# Patient Record
Sex: Female | Born: 1970 | Race: White | Hispanic: No | Smoking: Never smoker
Health system: Southern US, Community
[De-identification: ages and names within clinical notes are randomized; demographics above are authoritative.]

## PROBLEM LIST (undated history)

## (undated) DIAGNOSIS — F32A Depression, unspecified: Secondary | ICD-10-CM

## (undated) DIAGNOSIS — K5190 Ulcerative colitis, unspecified, without complications: Secondary | ICD-10-CM

## (undated) DIAGNOSIS — F329 Major depressive disorder, single episode, unspecified: Secondary | ICD-10-CM

## (undated) DIAGNOSIS — I10 Essential (primary) hypertension: Secondary | ICD-10-CM

## (undated) HISTORY — DX: Essential (primary) hypertension: I10

## (undated) HISTORY — PX: CATARACT EXTRACTION: SUR2

## (undated) HISTORY — DX: Depression, unspecified: F32.A

## (undated) HISTORY — PX: OTHER SURGICAL HISTORY: SHX169

## (undated) HISTORY — DX: Ulcerative colitis, unspecified, without complications: K51.90

---
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
## 2016-09-09 HISTORY — PX: COLECTOMY WITH COLOSTOMY CREATION/HARTMANN PROCEDURE: SHX6598

## 2017-10-17 ENCOUNTER — Other Ambulatory Visit: Payer: Self-pay | Admitting: Physician Assistant

## 2017-10-17 DIAGNOSIS — Z139 Encounter for screening, unspecified: Secondary | ICD-10-CM

## 2017-11-06 ENCOUNTER — Ambulatory Visit: Payer: Self-pay

## 2019-05-20 ENCOUNTER — Other Ambulatory Visit: Payer: Self-pay

## 2019-05-20 ENCOUNTER — Ambulatory Visit (INDEPENDENT_AMBULATORY_CARE_PROVIDER_SITE_OTHER): Payer: BC Managed Care – PPO | Admitting: Neurology

## 2019-05-20 ENCOUNTER — Encounter: Payer: Self-pay | Admitting: Neurology

## 2019-05-20 ENCOUNTER — Telehealth: Payer: Self-pay

## 2019-05-20 VITALS — BP 140/70 | HR 75 | Temp 96.2°F | Ht 66.0 in | Wt 278.0 lb

## 2019-05-20 DIAGNOSIS — G510 Bell's palsy: Secondary | ICD-10-CM

## 2019-05-20 MED ORDER — PREDNISONE 20 MG PO TABS: ORAL_TABLET | ORAL | 0 refills | Status: DC

## 2019-05-20 MED ORDER — PREDNISONE 20 MG PO TABS: ORAL_TABLET | ORAL | 0 refills | Status: AC

## 2019-05-20 MED ORDER — ACYCLOVIR 400 MG PO TABS: 800.0000 mg | ORAL_TABLET | Freq: Three times a day (TID) | ORAL | Status: DC

## 2019-05-20 NOTE — Progress Notes (Signed)
[facility] [address] [place]. [place] [address] [phone]       OFFICE CONSULT NOTE  [name] Date of Birth:  04-07-1971 Medical Record Number:  [id number]   Referring MD: Dr. Talbert Forest Reason for Referral: Facial weakness  HPI: Lindsay Diaz is a 48 year old pleasant Caucasian lady seen today for initial consultation visit for right facial weakness.  History is obtained from the patient and referral notes.  Patient states that 2 days ago she woke up and noticed that she has difficulty closing her right eye.  She also noticed that she had a right facial droop and was drooling from the right corner of the mouth and she had trouble blowing her right cheek.  She denied any hyperacusis, alteration of taste.  She denied any pain in the ear or behind the ear.  She does have prior history of right-sided Bell's palsy in October 2014.  At that time she was treated with a course of steroids and acyclovir and she had more severe weakness and took her nearly 7 to 8 months to recover completely.  She had a second episode in 2016 which was much milder and again she was treated with steroids and acyclovir and recovered in 4 to 6 weeks.  She had no respiratory residual weakness.  She has no prior history of tick bite and has not been tested for Lyme's disease.  She denies history suggestive of sarcoidosis.  She has had no brain imaging studies done in the form of CT scan or MRI.  She has a remote history of left frontal laceration following a fall requiring stitches in 2015.  She denies any history of ear injury, head injury with loss of consciousness of skull fracture decreased hearing or recurrent ear infections.  ROS:   14 system review of systems is positive for facial weakness, dryness of eyes eye closure weakness and all other systems negative  PMH:  Past Medical History:  Diagnosis Date  . Depression   . Hypertension   . Ulcerative colitis ([place])     Social History:   Social History   Socioeconomic History  . Marital status: Divorced    Spouse name: Not on file  . Number of children: Not on file  . Years of education: Not on file  . Highest education level: Not on file  Occupational History  . Not on file  Social Needs  . Financial resource strain: Not on file  . Food insecurity    Worry: Not on file    Inability: Not on file  . Transportation needs    Medical: Not on file    Non-medical: Not on file  Tobacco Use  . Smoking status: Never Smoker  . Smokeless tobacco: Never Used  Substance and Sexual Activity  . Alcohol use: Yes    Alcohol/week: 1.0 standard drinks    Types: 1 Glasses of wine per week  . Drug use: Not Currently  . Sexual activity: Not on file  Lifestyle  . Physical activity    Days per week: Not on file    Minutes per session: Not on file  . Stress: Not on file  Relationships  . Social Herbalist on phone: Not on file    Gets together: Not on file    Attends religious service: Not on file    Active member of club or organization: Not on file    Attends meetings of clubs or organizations: Not on file  Relationship status: Not on file  . Intimate partner violence    Fear of current or ex partner: Not on file    Emotionally abused: Not on file    Physically abused: Not on file    Forced sexual activity: Not on file  Other Topics Concern  . Not on file  Social History Narrative  . Not on file    Medications:   Current Outpatient Medications on File Prior to Visit  Medication Sig Dispense Refill  . Acetaminophen (TYLENOL 8 HOUR PO) Take 325 mg by mouth.    Marland Kitchen. atenolol (TENORMIN) 50 MG tablet Take 50 mg by mouth at bedtime.    Marland Kitchen. buPROPion (WELLBUTRIN SR) 150 MG 12 hr tablet Take 150 mg by mouth daily.    . citalopram (CELEXA) 40 MG tablet Take 40 mg by mouth daily.    . mesalamine (LIALDA) 1.2 g EC tablet Take 1.2 g by mouth 2 (two) times daily.    Marland Kitchen. zolpidem (AMBIEN) 10 MG tablet Take 10 mg by mouth  at bedtime.     No current facility-administered medications on file prior to visit.     Allergies:  Not on File  Physical Exam General: Mildly obese middle-aged Caucasian lady, seated, in no evident distress Head: head normocephalic and atraumatic.   Neck: supple with no carotid or supraclavicular bruits Cardiovascular: regular rate and rhythm, no murmurs Musculoskeletal: no deformity Skin:  no rash/petichiae Vascular:  Normal pulses all extremities  Neurologic Exam Mental Status: Awake and fully alert. Oriented to place and time. Recent and remote memory intact. Attention span, concentration and fund of knowledge appropriate. Mood and affect appropriate.  Cranial Nerves: Fundoscopic exam reveals sharp disc margins. Pupils equal, briskly reactive to light. Extraocular movements full without nystagmus. Visual fields full to confrontation. Hearing intact.  Right peripheral pattern facial nerve weakness with weakness of frowning, eye closure, smiling and blowing cheeks.  Sensation intact. Face, tongue, palate moves normally and symmetrically.  No hyperacusis or altered taste Motor: Normal bulk and tone. Normal strength in all tested extremity muscles. Sensory.: intact to touch , pinprick , position and vibratory sensation.  Coordination: Rapid alternating movements normal in all extremities. Finger-to-nose and heel-to-shin performed accurately bilaterally. Gait and Station: Arises from chair without difficulty. Stance is normal. Gait demonstrates normal stride length and balance . Able to heel, toe and tandem walk without difficulty.  Reflexes: 1+ and symmetric. Toes downgoing.      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     PLAN: I had a long discussion with the patient regarding her now recurrent third episode of right-sided facial weakness due to Bell's palsy.  I recommend she use artificial  tears liberally during the day and use Lacri-Lube ointment at night to prevent eye infection.  Prednisone 60 mg daily and taper over 2 weeks.  Acyclovir 800 mg 3 times daily for 10 days.  I have encouraged the patient to drink plenty of fluids and avoid dehydration.  She was also encouraged to do facial muscle exercises.  Check lab work for Lyme disease, sarcoidosis, CMP and CBC.  She will return for follow-up in the future in 2 months or call earlier if necessary. Delia HeadyPramod , MD  [facility] [address] [place], [place] [id number]  Phone [phone] Fax [phone] Note: This document was prepared with digital dictation and possible smart phrase technology. Any transcriptional errors that result from this process  are unintentional.

## 2019-05-20 NOTE — Telephone Encounter (Signed)
Prednisone rx fax to [facility] at [phone] and confirmed via fax.

## 2019-05-20 NOTE — Patient Instructions (Addendum)
I had a long discussion with the patient regarding her now recurrent third episode of right-sided facial weakness due to Bell's palsy.  I recommend she use artificial tears liberally during the day and use Lacri-Lube ointment at night to prevent eye infection.  Prednisone 60 mg daily and taper over 2 weeks.  Acyclovir 800 mg 3 times daily for 10 days.  I have encouraged the patient to drink plenty of fluids and avoid dehydration.  She was also encouraged to do facial muscle exercises.  Check lab work for Lyme disease, sarcoidosis, CMP and CBC.  She will return for follow-up in the future in 2 months or call earlier if necessary.   Bell Palsy, Adult  Bell palsy is a short-term inability to move muscles in part of the face. The inability to move (paralysis) results from inflammation or compression of the facial nerve, which travels along the skull and under the ear to the side of the face (7th cranial nerve). This nerve is responsible for facial movements that include blinking, closing the eyes, smiling, and frowning. What are the causes? The exact cause of this condition is not known. It may be caused by an infection from a virus, such as the chickenpox (herpes zoster), Epstein-Barr, or mumps virus. What increases the risk? You are more likely to develop this condition if:  You are pregnant.  You have diabetes.  You have had a recent infection in your nose, throat, or airways (upper respiratory infection).  You have a weakened body defense system (immune system).  You have had a facial injury, such as a fracture.  You have a family history of Bell palsy. What are the signs or symptoms? Symptoms of this condition include:  Weakness on one side of the face.  Drooping eyelid and corner of the mouth.  Excessive tearing in one eye.  Difficulty closing the eyelid.  Dry eye.  Drooling.  Dry mouth.  Changes in taste.  Change in facial appearance.  Pain behind one ear.  Ringing in  one or both ears.  Sensitivity to sound in one ear.  Facial twitching.  Headache.  Impaired speech.  Dizziness.  Difficulty eating or drinking. Most of the time, only one side of the face is affected. Rarely, Bell palsy affects the whole face. How is this diagnosed? This condition is diagnosed based on:  Your symptoms.  Your medical history.  A physical exam. You may also have to see health care providers who specialize in disorders of the nerves (neurologist) or diseases and conditions of the eye (ophthalmologist). You may have tests, such as:  A test to check for nerve damage (electromyogram).  Imaging studies, such as CT or MRI scans.  Blood tests. How is this treated? This condition affects every person differently. Sometimes symptoms go away without treatment within a couple weeks. If treatment is needed, it varies from person to person. The goal of treatment is to reduce inflammation and protect the eye from damage. Treatment for Bell palsy may include:  Medicines, such as: ? Steroids to reduce swelling and inflammation. ? Antiviral drugs. ? Pain relievers, including aspirin, acetaminophen, or ibuprofen.  Eye drops or ointment to keep your eye moist.  Eye protection, if you cannot close your eye.  Exercises or massage to regain muscle strength and function (physical therapy). Follow these instructions at home:   Take over-the-counter and prescription medicines only as told by your health care provider.  If your eye is affected: ? Keep your eye moist with  eye drops or ointment as told by your health care provider. ? Follow instructions for eye care and protection as told by your health care provider.  Do any physical therapy exercises as told by your health care provider.  Keep all follow-up visits as told by your health care provider. This is important. Contact a health care provider if:  You have a fever.  Your symptoms do not get better within 2-3  weeks, or your symptoms get worse.  Your eye is red, irritated, or painful.  You have new symptoms. Get help right away if:  You have weakness or numbness in a part of your body other than your face.  You have trouble swallowing.  You develop neck pain or stiffness.  You develop dizziness or shortness of breath. Summary  Bell palsy is a short-term inability to move muscles in part of the face. The inability to move (paralysis) results from inflammation or compression of the facial nerve.  This condition affects every person differently. Sometimes symptoms go away without treatment within a couple weeks.  If treatment is needed, it varies from person to person. The goal of treatment is to reduce inflammation and protect the eye from damage.  Contact your health care provider if your symptoms do not get better within 2-3 weeks, or your symptoms get worse. This information is not intended to replace advice given to you by your health care provider. Make sure you discuss any questions you have with your health care provider. Document Released: 08/26/2005 Document Revised: 08/08/2017 Document Reviewed: 10/29/2016 Elsevier Patient Education  2020 [employer]

## 2019-05-21 ENCOUNTER — Telehealth: Payer: Self-pay | Admitting: Diagnostic Neuroimaging

## 2019-05-21 MED ORDER — ACYCLOVIR 800 MG PO TABS: 800.0000 mg | ORAL_TABLET | Freq: Three times a day (TID) | ORAL | 0 refills | Status: DC

## 2019-05-21 NOTE — Telephone Encounter (Signed)
Patient called in because acyclovir rx not received by pharmacy. I resent.  Meds ordered this encounter  Medications  . acyclovir (ZOVIRAX) 800 MG tablet    Sig: Take 1 tablet (800 mg total) by mouth 3 (three) times daily.    Dispense:  30 tablet    Refill:  0    From Dr. Mahlon Gammon, MD 8/88/7579, 7:28 PM Certified in Neurology, Neurophysiology and Neuroimaging  [facility] [address], [place], [place] [address] [phone]

## 2019-05-24 NOTE — Telephone Encounter (Signed)
Thanks for resending.

## 2019-05-25 ENCOUNTER — Telehealth: Payer: Self-pay | Admitting: Neurology

## 2019-05-25 NOTE — Telephone Encounter (Signed)
Pt has returned the call to [facility], please call back

## 2019-05-25 NOTE — Telephone Encounter (Signed)
LVM for pt to call back about scheduling mri  [employer] Auth: [id number] (exp. 05/24/19 to 11/19/19)

## 2019-05-26 LAB — CBC
Hematocrit: 36.2 % (ref 34.0–46.6)
Hemoglobin: 11.7 g/dL (ref 11.1–15.9)
MCH: 27.5 pg (ref 26.6–33.0)
MCHC: 32.3 g/dL (ref 31.5–35.7)
MCV: 85 fL (ref 79–97)
Platelets: 302 10*3/uL (ref 150–450)
RBC: 4.26 x10E6/uL (ref 3.77–5.28)
RDW: 14.1 % (ref 11.7–15.4)
WBC: 5.5 10*3/uL (ref 3.4–10.8)

## 2019-05-26 LAB — LYME DISEASE, WESTERN BLOT
IgG P18 Ab.: ABSENT
IgG P23 Ab.: ABSENT
IgG P28 Ab.: ABSENT
IgG P30 Ab.: ABSENT
IgG P41 Ab.: ABSENT
IgG P45 Ab.: ABSENT
IgG P66 Ab.: ABSENT
IgG P93 Ab.: ABSENT
IgM P23 Ab.: ABSENT
IgM P39 Ab.: ABSENT
IgM P41 Ab.: ABSENT
Lyme IgG Wb: NEGATIVE
Lyme IgM Wb: NEGATIVE

## 2019-05-26 LAB — LYME AB/WESTERN BLOT REFLEX
LYME DISEASE AB, QUANT, IGM: <0.8 index (ref 0.00–0.79)
Lyme IgG/IgM Ab: <0.91 {ISR} (ref 0.00–0.90)

## 2019-05-26 LAB — COMPREHENSIVE METABOLIC PANEL
ALT: 14 IU/L (ref 0–32)
AST: 13 IU/L (ref 0–40)
Albumin/Globulin Ratio: 1.4 (ref 1.2–2.2)
Albumin: 3.9 g/dL (ref 3.8–4.8)
Alkaline Phosphatase: 57 IU/L (ref 39–117)
BUN/Creatinine Ratio: 10 (ref 9–23)
BUN: 9 mg/dL (ref 6–24)
Bilirubin Total: <0.2 mg/dL (ref 0.0–1.2)
CO2: 24 mmol/L (ref 20–29)
Calcium: 9.5 mg/dL (ref 8.7–10.2)
Chloride: 105 mmol/L (ref 96–106)
Creatinine, Ser: 0.86 mg/dL (ref 0.57–1.00)
GFR calc Af Amer: 91 mL/min/{1.73_m2} (ref >59)
GFR calc non Af Amer: 79 mL/min/{1.73_m2} (ref >59)
Globulin, Total: 2.8 g/dL (ref 1.5–4.5)
Glucose: 82 mg/dL (ref 65–99)
Potassium: 4.4 mmol/L (ref 3.5–5.2)
Sodium: 138 mmol/L (ref 134–144)
Total Protein: 6.7 g/dL (ref 6.0–8.5)

## 2019-05-26 LAB — ENA+DNA/DS+SJORGEN'S
ENA RNP Ab: 0.2 AI (ref 0.0–0.9)
ENA SM Ab Ser-aCnc: <0.2 AI (ref 0.0–0.9)
ENA SSA (RO) Ab: 0.2 AI (ref 0.0–0.9)
ENA SSB (LA) Ab: <0.2 AI (ref 0.0–0.9)
dsDNA Ab: 10 IU/mL — ABNORMAL HIGH (ref 0–9)

## 2019-05-26 LAB — SEDIMENTATION RATE: Sed Rate: 19 mm/hr (ref 0–40)

## 2019-05-26 LAB — ANGIOTENSIN CONVERTING ENZYME: Angio Convert Enzyme: 41 U/L (ref 14–82)

## 2019-05-26 LAB — ANA W/REFLEX: Anti Nuclear Antibody (ANA): POSITIVE — AB

## 2019-05-26 NOTE — Telephone Encounter (Signed)
Pt has returned the call to Emily, she is asking for a call back °

## 2019-05-26 NOTE — Telephone Encounter (Signed)
LVM for patient to call back about scheduling mri.

## 2019-05-27 NOTE — Telephone Encounter (Signed)
LVM for patient to call back about scheduling mri.  °

## 2019-06-01 ENCOUNTER — Telehealth: Payer: Self-pay

## 2019-06-01 NOTE — Telephone Encounter (Signed)
-----   Message from Garvin Fila, MD sent at 06/01/2019  8:47 AM EDT ----- Mitchell Heir inform the patient that comprehensive metabolic panel labs, CBC, test for sarcoidosis , vasculitis,and Lyme's was negative.  Blood work for lupus was slightly positive but she has no clinical symptomatology of lupus so it is likely a lab error.

## 2019-06-01 NOTE — Telephone Encounter (Signed)
Notes recorded by Marval Regal, RN on 06/01/2019 at 4:43 PM EDT  Left vm for patient to call back about lab results.  ------

## 2019-06-02 NOTE — Telephone Encounter (Signed)
I called pt and left vm for lab work results.

## 2019-06-02 NOTE — Telephone Encounter (Signed)
Patient returned your call to go over lab results. Best call back number is [phone]

## 2019-06-02 NOTE — Telephone Encounter (Signed)
Notes recorded by Marval Regal, RN on 06/02/2019 at 10:11 AM EDT  Left vm for patient to call back about lab work results.  ------

## 2019-06-03 NOTE — Telephone Encounter (Signed)
I called pt back about her lab work results. I stated that her comprehensive metabolic panel labs, CBC, test for sarcoidosis , vasculitis,and Lyme's was negative. Blood work for lupus was slightly positive but she has no clinical symptomatology of lupus so it is likely a lab error.Pt verbalized understanding.  ------

## 2019-07-07 ENCOUNTER — Other Ambulatory Visit: Payer: Self-pay

## 2019-07-07 DIAGNOSIS — Z20822 Contact with and (suspected) exposure to covid-19: Secondary | ICD-10-CM

## 2019-07-08 LAB — NOVEL CORONAVIRUS, NAA: SARS-CoV-2, NAA: NOT DETECTED

## 2019-07-27 ENCOUNTER — Ambulatory Visit: Payer: BC Managed Care – PPO | Admitting: Neurology

## 2019-08-10 ENCOUNTER — Ambulatory Visit: Payer: BC Managed Care – PPO | Admitting: Neurology

## 2019-08-10 ENCOUNTER — Telehealth: Payer: Self-pay

## 2019-08-10 NOTE — Telephone Encounter (Signed)
PT no show for appt today. 

## 2019-08-17 ENCOUNTER — Encounter: Payer: Self-pay | Admitting: Neurology

## 2020-03-15 ENCOUNTER — Telehealth: Payer: Self-pay | Admitting: General Practice

## 2020-03-15 NOTE — Telephone Encounter (Signed)
Called patient in response to her email request to schedule as a new patient. Left voicemail for her to call the office to schedule.

## 2020-06-23 ENCOUNTER — Other Ambulatory Visit: Payer: Self-pay | Admitting: Family Medicine

## 2020-06-23 DIAGNOSIS — Z1231 Encounter for screening mammogram for malignant neoplasm of breast: Secondary | ICD-10-CM

## 2020-07-18 DIAGNOSIS — Z1231 Encounter for screening mammogram for malignant neoplasm of breast: Secondary | ICD-10-CM

## 2021-03-18 ENCOUNTER — Emergency Department (HOSPITAL_COMMUNITY): Payer: BC Managed Care – PPO

## 2021-03-18 ENCOUNTER — Emergency Department (HOSPITAL_COMMUNITY)
Admission: EM | Admit: 2021-03-18 | Discharge: 2021-03-19 | Disposition: A | Discharge status: Home / Self Care | Payer: BC Managed Care – PPO | Attending: Emergency Medicine | Admitting: Emergency Medicine

## 2021-03-18 ENCOUNTER — Encounter (HOSPITAL_COMMUNITY): Payer: Self-pay

## 2021-03-18 ENCOUNTER — Other Ambulatory Visit: Payer: Self-pay

## 2021-03-18 DIAGNOSIS — Z79899 Other long term (current) drug therapy: Secondary | ICD-10-CM | POA: Diagnosis not present

## 2021-03-18 DIAGNOSIS — I10 Essential (primary) hypertension: Secondary | ICD-10-CM | POA: Diagnosis not present

## 2021-03-18 DIAGNOSIS — R109 Unspecified abdominal pain: Secondary | ICD-10-CM | POA: Diagnosis not present

## 2021-03-18 DIAGNOSIS — R110 Nausea: Secondary | ICD-10-CM | POA: Diagnosis not present

## 2021-03-18 DIAGNOSIS — N200 Calculus of kidney: Secondary | ICD-10-CM

## 2021-03-18 LAB — CBC WITH DIFFERENTIAL/PLATELET
Abs Immature Granulocytes: 0.01 10*3/uL (ref 0.00–0.07)
Basophils Absolute: 0 10*3/uL (ref 0.0–0.1)
Basophils Relative: 1 %
Eosinophils Absolute: 0.2 10*3/uL (ref 0.0–0.5)
Eosinophils Relative: 2 %
HCT: 38.8 % (ref 36.0–46.0)
Hemoglobin: 12.4 g/dL (ref 12.0–15.0)
Immature Granulocytes: 0 %
Lymphocytes Relative: 22 %
Lymphs Abs: 1.4 10*3/uL (ref 0.7–4.0)
MCH: 28.6 pg (ref 26.0–34.0)
MCHC: 32 g/dL (ref 30.0–36.0)
MCV: 89.6 fL (ref 80.0–100.0)
Monocytes Absolute: 0.6 10*3/uL (ref 0.1–1.0)
Monocytes Relative: 9 %
Neutro Abs: 4.3 10*3/uL (ref 1.7–7.7)
Neutrophils Relative %: 66 %
Platelets: 351 10*3/uL (ref 150–400)
RBC: 4.33 MIL/uL (ref 3.87–5.11)
RDW: 13.1 % (ref 11.5–15.5)
WBC: 6.6 10*3/uL (ref 4.0–10.5)
nRBC: 0 % (ref 0.0–0.2)

## 2021-03-18 LAB — COMPREHENSIVE METABOLIC PANEL
ALT: 22 U/L (ref 0–44)
AST: 23 U/L (ref 15–41)
Albumin: 4 g/dL (ref 3.5–5.0)
Alkaline Phosphatase: 65 U/L (ref 38–126)
Anion gap: 9 (ref 5–15)
BUN: 15 mg/dL (ref 6–20)
CO2: 23 mmol/L (ref 22–32)
Calcium: 9.2 mg/dL (ref 8.9–10.3)
Chloride: 108 mmol/L (ref 98–111)
Creatinine, Ser: 0.75 mg/dL (ref 0.44–1.00)
GFR, Estimated: >60 mL/min (ref >60)
Glucose, Bld: 96 mg/dL (ref 70–99)
Potassium: 3.7 mmol/L (ref 3.5–5.1)
Sodium: 140 mmol/L (ref 135–145)
Total Bilirubin: 0.5 mg/dL (ref 0.3–1.2)
Total Protein: 7.4 g/dL (ref 6.5–8.1)

## 2021-03-18 LAB — URINALYSIS, ROUTINE W REFLEX MICROSCOPIC
Bacteria, UA: NONE SEEN
Bilirubin Urine: NEGATIVE
Glucose, UA: NEGATIVE mg/dL
Ketones, ur: NEGATIVE mg/dL
Leukocytes,Ua: NEGATIVE
Nitrite: NEGATIVE
Protein, ur: NEGATIVE mg/dL
RBC / HPF: >50 RBC/hpf — ABNORMAL HIGH (ref 0–5)
Specific Gravity, Urine: 1.025 (ref 1.005–1.030)
pH: 5 (ref 5.0–8.0)

## 2021-03-18 LAB — LACTIC ACID, PLASMA: Lactic Acid, Venous: 1 mmol/L (ref 0.5–1.9)

## 2021-03-18 LAB — LIPASE, BLOOD: Lipase: 35 U/L (ref 11–51)

## 2021-03-18 MED ORDER — IOHEXOL 350 MG/ML SOLN
80.0000 mL | Freq: Once | INTRAVENOUS | Status: AC
  Administered 2021-03-18: 80 mL via INTRAVENOUS

## 2021-03-18 NOTE — ED Notes (Signed)
Patient ambulated to X-ray 

## 2021-03-18 NOTE — ED Provider Notes (Signed)
Emergency Medicine Provider Triage Evaluation Note  Lindsay Diaz , a 50 y.o. female  was evaluated in triage.  Pt complains of RLQ abd pain around 6pm constant since. 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   Denies any urinary symptoms other than pressure when peeing.   Normal ostomy output. No inc watery DC. No bloody OP.   Review of Systems  Positive: Abd pain Negative: Fever   Physical Exam  BP (!) 166/96   Pulse 74   Temp 98.3 F (36.8 C) (Oral)   Resp 16   Ht 5\' 7"  (1.702 m)   Wt 129.7 kg   SpO2 100%   BMI 44.79 kg/m  Gen:   Awake, no distress   Resp:  Normal effort  MSK:   Moves extremities without difficulty  Other:  Abd soft. Ostomy in LLQ without TTP.  Some RUQ TTP, neg murphy sign.   Medical Decision Making  Medically screening exam initiated at 7:37 PM.  Appropriate orders placed.  Lindsay Diaz was informed that the remainder of the evaluation will be completed by another provider, this initial triage assessment does not replace that evaluation, and the importance of remaining in the ED until their evaluation is complete.  Will obtain xray to rule perforation less likely . Nephrolithiasis vs biliary vs gastritis thouught most likely.     Conley Simmonds Tutwiler, DOLE 03/18/21 1941    05/19/21, MD 03/18/21 [id number]

## 2021-03-18 NOTE — ED Triage Notes (Signed)
Pt complains of nausea and right sided flank pain that radiates to the front and back that started at 6 pm. Pt has an ostomy to her left lower quadrant and has a history of ulcerative colitis.

## 2021-03-18 NOTE — ED Notes (Signed)
Pt ambulated to restroom without assistance.

## 2021-03-18 NOTE — ED Notes (Signed)
Patient transported to CT 

## 2021-03-18 NOTE — ED Provider Notes (Signed)
[facility]-EMERGENCY DEPT Provider Note   CSN: [id number] Arrival date & time: 03/18/21  1908     History Chief Complaint  Patient presents with   Nausea   Flank Pain    Lindsay Diaz is a 50 y.o. female.  The history is provided by the patient and medical records. No language interpreter was used.  Flank Pain This is a new problem. The current episode started 3 to 5 hours ago. The problem occurs constantly. The problem has been gradually improving. Associated symptoms include abdominal pain. Pertinent negatives include no chest pain, no headaches and no shortness of breath. Nothing aggravates the symptoms. Nothing relieves the symptoms. She has tried nothing for the symptoms. The treatment provided no relief.      Past Medical History:  Diagnosis Date   Depression    Hypertension    Ulcerative colitis (HCC)     There are no problems to display for this patient.   Past Surgical History:  Procedure Laterality Date   CATARACT EXTRACTION     right eye   CESAREAN SECTION     x3   osotmy       OB History   No obstetric history on file.     History reviewed. No pertinent family history.  Social History   Tobacco Use   Smoking status: Never   Smokeless tobacco: Never  Substance Use Topics   Alcohol use: Not Currently    Alcohol/week: 1.0 standard drink    Types: 1 Glasses of wine per week   Drug use: Not Currently    Home Medications Prior to Admission medications   Medication Sig Start Date End Date Taking? Authorizing Provider  Acetaminophen (TYLENOL 8 HOUR PO) Take 325 mg by mouth.    [provider]  acyclovir (ZOVIRAX) 800 MG tablet Take 1 tablet (800 mg total) by mouth 3 (three) times daily. 05/21/19   Penumalli, Glenford Bayley, MD  atenolol (TENORMIN) 50 MG tablet Take 50 mg by mouth at bedtime.    [provider]  buPROPion (WELLBUTRIN SR) 150 MG 12 hr tablet Take 150 mg by mouth daily.    [provider]   citalopram (CELEXA) 40 MG tablet Take 40 mg by mouth daily.    [provider]  mesalamine (LIALDA) 1.2 g EC tablet Take 1.2 g by mouth 2 (two) times daily.    [provider]  zolpidem (AMBIEN) 10 MG tablet Take 10 mg by mouth at bedtime.    [provider]    Allergies    Patient has no allergy information on record.  Review of Systems   Review of Systems  Constitutional:  Negative for chills, diaphoresis, fatigue and fever.  HENT:  Negative for congestion.   Respiratory:  Negative for cough, chest tightness, shortness of breath and wheezing.   Cardiovascular:  Negative for chest pain, palpitations and leg swelling.  Gastrointestinal:  Positive for abdominal pain and nausea. Negative for constipation, diarrhea and vomiting.  Genitourinary:  Positive for flank pain. Negative for dysuria and frequency.  Musculoskeletal:  Positive for back pain (r flank and back). Negative for neck pain and neck stiffness.  Skin:  Negative for rash and wound.  Neurological:  Negative for dizziness, weakness, light-headedness, numbness and headaches.  Psychiatric/Behavioral:  Negative for agitation.   All other systems reviewed and are negative.  Physical Exam Updated Vital Signs BP (!) 172/101   Pulse 88   Temp 98.4 F (36.9 C) (Oral)   Resp  16   Ht 5\' 7"  (1.702 m)   Wt 129.7 kg   SpO2 100%   BMI 44.79 kg/m   Physical Exam Vitals and nursing note reviewed.  Constitutional:      General: She is not in acute distress.    Appearance: She is well-developed. She is not ill-appearing, toxic-appearing or diaphoretic.  HENT:     Head: Normocephalic and atraumatic.  Eyes:     Conjunctiva/sclera: Conjunctivae normal.  Cardiovascular:     Rate and Rhythm: Normal rate and regular rhythm.     Heart sounds: No murmur heard. Pulmonary:     Effort: Pulmonary effort is normal. No respiratory distress.     Breath sounds: Normal breath sounds. No wheezing or rhonchi.  Chest:      Chest wall: No tenderness.  Abdominal:     General: Abdomen is flat.     Palpations: Abdomen is soft.     Tenderness: There is abdominal tenderness. There is no right CVA tenderness, left CVA tenderness, guarding or rebound.  Musculoskeletal:        General: No tenderness.     Cervical back: Neck supple. No tenderness.  Skin:    General: Skin is warm and dry.     Findings: No erythema.  Neurological:     General: No focal deficit present.     Mental Status: She is alert.  Psychiatric:        Mood and Affect: Mood normal.    ED Results / Procedures / Treatments   Labs (all labs ordered are listed, but only abnormal results are displayed) Labs Reviewed  URINALYSIS, ROUTINE W REFLEX MICROSCOPIC - Abnormal; Notable for the following components:      Result Value   Hgb urine dipstick LARGE (*)    RBC / HPF >50 (*)    All other components within normal limits  URINE CULTURE  CBC WITH DIFFERENTIAL/PLATELET  COMPREHENSIVE METABOLIC PANEL  LIPASE, BLOOD  LACTIC ACID, PLASMA    EKG None  Radiology DG Chest 2 View  Result Date: 03/18/2021 CLINICAL DATA:  Abdominal pain history of bowel perforation. EXAM: CHEST - 2 VIEW COMPARISON:  None. FINDINGS: The heart size and mediastinal contours are within normal limits. No focal consolidation. No pleural effusion. No pneumothorax. The visualized skeletal structures are unremarkable. No subdiaphragmatic free air visualized. IMPRESSION: 1. No acute cardiopulmonary process. 2. No pneumoperitoneum visualized. Electronically Signed   By: 05/19/2021 MD   On: 03/18/2021 20:22    Procedures Procedures   Medications Ordered in ED Medications  iohexol (OMNIPAQUE) 350 MG/ML injection 80 mL (80 mLs Intravenous Contrast Given 03/18/21 2306)    ED Course  I have reviewed the triage vital signs and the nursing notes.  Pertinent labs & imaging results that were available during my care of the patient were reviewed by me and considered in my  medical decision making (see chart for details).    MDM Rules/Calculators/A&P                          Lindsay Diaz is a 50 y.o. female with a past medical history significant for hypertension, kidney stones, and ulcerative colitis who presents with right sided abdominal pain and right flank pain.  Patient reports that symptoms began today and were sharp in quality.  She reports it was up to an 8 out of 10 in severity but has been improving.  She reports some nausea and vomiting and cannot get  comfortable.  She denies any left-sided abdominal pain.  She denies constipation, diarrhea, or pelvic symptoms.  She does report her urine has been darker.  She denies any fevers or chills and denies any URI symptoms.  No chest pain or shortness of breath.  No palpitations.  No rashes seen to suggest shingles.  On exam, lungs are clear and chest is nontender.  Right lower quadrant is tender to palpation as well as the right flank.  No rash seen.  No CVA tenderness.  Exam otherwise unremarkable.  Clinically I suspect kidney stone however as she is very tender in the right lower quadrant, I do feel need to get imaging to rule out appendicitis as well.  Do not suspect ulcerative colitis given the location of her discomfort.  Patient reports her pain is improving and does not want pain or nausea medicine initially.  We will get screening labs and imaging.  Anticipate reassessment after work-up.  Care transferred to oncoming team awaiting for results of CT scan.  Anticipate reassessment after work-up to determine disposition.  Final Clinical Impression(s) / ED Diagnoses Final diagnoses:  Right flank pain   Clinical Impression: 1. Right flank pain     Disposition: Care transferred to oncoming team awaiting for results of CT scan.  Anticipate reassessment after work-up to determine disposition.  This note was prepared with assistance of Conservation officer, historic buildings. Occasional wrong-word or  sound-a-like substitutions may have occurred due to the inherent limitations of voice recognition software.     Alyscia Carmon, Canary Brim, MD 03/19/21 0001

## 2021-03-19 MED ORDER — OXYCODONE HCL 5 MG PO TABS: 5.0000 mg | ORAL_TABLET | ORAL | 0 refills | Status: DC | PRN

## 2021-03-19 MED ORDER — TAMSULOSIN HCL 0.4 MG PO CAPS: 0.4000 mg | ORAL_CAPSULE | Freq: Every day | ORAL | 0 refills | Status: DC

## 2021-03-19 NOTE — Discharge Instructions (Signed)
You were evaluated in the Emergency Department and after careful evaluation, we did not find any emergent condition requiring admission or further testing in the hospital.  Your exam/testing today was overall reassuring.  Please return to the Emergency Department if you experience any worsening of your condition.  Thank you for allowing us to be a part of your care.  

## 2021-03-19 NOTE — ED Notes (Signed)
ED Provider at bedside. 

## 2021-03-19 NOTE — ED Provider Notes (Signed)
  Provider Note MRN:  [id number]  Arrival date & time: 03/19/21    ED Course and Medical Decision Making  Assumed care from Dr. Rush Landmark at shift change.  6 mm proximal stone, will reassess.  If pain well controlled will discharge with close follow-up/referral to urology.  On reassessment patient is feeling well, pain  Procedures  Final Clinical Impressions(s) / ED Diagnoses     ICD-10-CM   1. Right flank pain  R10.9     2. Kidney stone  N20.0       ED Discharge Orders          Ordered    tamsulosin (FLOMAX) 0.4 MG CAPS capsule  Daily        03/19/21 0049    oxyCODONE (ROXICODONE) 5 MG immediate release tablet  Every 4 hours PRN        03/19/21 0049              Discharge Instructions      You were evaluated in the Emergency Department and after careful evaluation, we did not find any emergent condition requiring admission or further testing in the hospital.  Your exam/testing today was overall reassuring.  Please return to the Emergency Department if you experience any worsening of your condition.  Thank you for allowing [place] to be a part of your care.       Elmer Sow. Pilar Plate, MD [facility] Emergency Medicine [facility] [email or web address]    Sabas Sous, MD 03/19/21 [phone]

## 2021-03-20 LAB — URINE CULTURE

## 2021-11-10 ENCOUNTER — Inpatient Hospital Stay (HOSPITAL_BASED_OUTPATIENT_CLINIC_OR_DEPARTMENT_OTHER)
Admission: EM | Admit: 2021-11-10 | Discharge: 2021-11-17 | DRG: 336 | Disposition: A | Discharge status: Home / Self Care | Payer: BC Managed Care – PPO | Attending: Internal Medicine | Admitting: Internal Medicine

## 2021-11-10 ENCOUNTER — Encounter (HOSPITAL_BASED_OUTPATIENT_CLINIC_OR_DEPARTMENT_OTHER): Payer: Self-pay | Admitting: Emergency Medicine

## 2021-11-10 ENCOUNTER — Other Ambulatory Visit: Payer: Self-pay

## 2021-11-10 ENCOUNTER — Emergency Department (HOSPITAL_BASED_OUTPATIENT_CLINIC_OR_DEPARTMENT_OTHER): Payer: BC Managed Care – PPO

## 2021-11-10 DIAGNOSIS — Z79899 Other long term (current) drug therapy: Secondary | ICD-10-CM | POA: Diagnosis not present

## 2021-11-10 DIAGNOSIS — I10 Essential (primary) hypertension: Secondary | ICD-10-CM | POA: Diagnosis present

## 2021-11-10 DIAGNOSIS — K631 Perforation of intestine (nontraumatic): Secondary | ICD-10-CM | POA: Diagnosis not present

## 2021-11-10 DIAGNOSIS — D75839 Thrombocytosis, unspecified: Secondary | ICD-10-CM | POA: Diagnosis present

## 2021-11-10 DIAGNOSIS — K433 Parastomal hernia with obstruction, without gangrene: Secondary | ICD-10-CM | POA: Diagnosis present

## 2021-11-10 DIAGNOSIS — K56609 Unspecified intestinal obstruction, unspecified as to partial versus complete obstruction: Principal | ICD-10-CM | POA: Diagnosis present

## 2021-11-10 DIAGNOSIS — Z9841 Cataract extraction status, right eye: Secondary | ICD-10-CM

## 2021-11-10 DIAGNOSIS — K660 Peritoneal adhesions (postprocedural) (postinfection): Secondary | ICD-10-CM | POA: Diagnosis present

## 2021-11-10 DIAGNOSIS — K567 Ileus, unspecified: Secondary | ICD-10-CM | POA: Diagnosis not present

## 2021-11-10 DIAGNOSIS — R188 Other ascites: Secondary | ICD-10-CM | POA: Diagnosis present

## 2021-11-10 DIAGNOSIS — Z20822 Contact with and (suspected) exposure to covid-19: Secondary | ICD-10-CM | POA: Diagnosis present

## 2021-11-10 DIAGNOSIS — Z933 Colostomy status: Secondary | ICD-10-CM

## 2021-11-10 DIAGNOSIS — Z0189 Encounter for other specified special examinations: Secondary | ICD-10-CM

## 2021-11-10 DIAGNOSIS — Z6841 Body Mass Index (BMI) 40.0 and over, adult: Secondary | ICD-10-CM

## 2021-11-10 DIAGNOSIS — R8271 Bacteriuria: Secondary | ICD-10-CM | POA: Diagnosis present

## 2021-11-10 DIAGNOSIS — E876 Hypokalemia: Secondary | ICD-10-CM | POA: Diagnosis not present

## 2021-11-10 DIAGNOSIS — F32A Depression, unspecified: Secondary | ICD-10-CM | POA: Diagnosis present

## 2021-11-10 DIAGNOSIS — K5190 Ulcerative colitis, unspecified, without complications: Secondary | ICD-10-CM | POA: Diagnosis present

## 2021-11-10 DIAGNOSIS — Z888 Allergy status to other drugs, medicaments and biological substances status: Secondary | ICD-10-CM

## 2021-11-10 DIAGNOSIS — K559 Vascular disorder of intestine, unspecified: Secondary | ICD-10-CM | POA: Diagnosis present

## 2021-11-10 DIAGNOSIS — Z4659 Encounter for fitting and adjustment of other gastrointestinal appliance and device: Secondary | ICD-10-CM

## 2021-11-10 LAB — COMPREHENSIVE METABOLIC PANEL
ALT: 20 U/L (ref 0–44)
AST: 24 U/L (ref 15–41)
Albumin: 4.2 g/dL (ref 3.5–5.0)
Alkaline Phosphatase: 71 U/L (ref 38–126)
Anion gap: 12 (ref 5–15)
BUN: 14 mg/dL (ref 6–20)
CO2: 23 mmol/L (ref 22–32)
Calcium: 9.6 mg/dL (ref 8.9–10.3)
Chloride: 103 mmol/L (ref 98–111)
Creatinine, Ser: 0.93 mg/dL (ref 0.44–1.00)
GFR, Estimated: >60 mL/min (ref >60)
Glucose, Bld: 128 mg/dL — ABNORMAL HIGH (ref 70–99)
Potassium: 4 mmol/L (ref 3.5–5.1)
Sodium: 138 mmol/L (ref 135–145)
Total Bilirubin: 0.4 mg/dL (ref 0.3–1.2)
Total Protein: 8.2 g/dL — ABNORMAL HIGH (ref 6.5–8.1)

## 2021-11-10 LAB — CBC
HCT: 41.1 % (ref 36.0–46.0)
Hemoglobin: 13.5 g/dL (ref 12.0–15.0)
MCH: 28.3 pg (ref 26.0–34.0)
MCHC: 32.8 g/dL (ref 30.0–36.0)
MCV: 86.2 fL (ref 80.0–100.0)
Platelets: 422 10*3/uL — ABNORMAL HIGH (ref 150–400)
RBC: 4.77 MIL/uL (ref 3.87–5.11)
RDW: 13 % (ref 11.5–15.5)
WBC: 9.9 10*3/uL (ref 4.0–10.5)
nRBC: 0 % (ref 0.0–0.2)

## 2021-11-10 LAB — URINALYSIS, ROUTINE W REFLEX MICROSCOPIC
Bilirubin Urine: NEGATIVE
Glucose, UA: NEGATIVE mg/dL
Ketones, ur: NEGATIVE mg/dL
Leukocytes,Ua: NEGATIVE
Nitrite: NEGATIVE
Protein, ur: 30 mg/dL — AB
Specific Gravity, Urine: 1.01 (ref 1.005–1.030)
pH: 7 (ref 5.0–8.0)

## 2021-11-10 LAB — LIPASE, BLOOD: Lipase: 38 U/L (ref 11–51)

## 2021-11-10 LAB — RESP PANEL BY RT-PCR (FLU A&B, COVID) ARPGX2
Influenza A by PCR: NEGATIVE
Influenza B by PCR: NEGATIVE
SARS Coronavirus 2 by RT PCR: NEGATIVE

## 2021-11-10 LAB — URINALYSIS, MICROSCOPIC (REFLEX)

## 2021-11-10 MED ORDER — DIATRIZOATE MEGLUMINE & SODIUM 66-10 % PO SOLN
90.0000 mL | Freq: Once | ORAL | Status: AC
  Administered 2021-11-11: 90 mL via NASOGASTRIC
  Filled 2021-11-10 (×2): qty 90

## 2021-11-10 MED ORDER — HYDRALAZINE HCL 20 MG/ML IJ SOLN: 5.0000 mg | Freq: Four times a day (QID) | INTRAMUSCULAR | Status: DC | PRN

## 2021-11-10 MED ORDER — DIPHENHYDRAMINE HCL 50 MG/ML IJ SOLN
25.0000 mg | Freq: Once | INTRAMUSCULAR | Status: AC
  Administered 2021-11-10: 25 mg via INTRAVENOUS
  Filled 2021-11-10: qty 1

## 2021-11-10 MED ORDER — HYDROMORPHONE HCL 1 MG/ML IJ SOLN
0.5000 mg | Freq: Once | INTRAMUSCULAR | Status: AC
  Administered 2021-11-10: 0.5 mg via INTRAVENOUS
  Filled 2021-11-10: qty 1

## 2021-11-10 MED ORDER — SODIUM CHLORIDE 0.9 % IV SOLN: INTRAVENOUS | Status: DC

## 2021-11-10 MED ORDER — ONDANSETRON HCL 4 MG/2ML IJ SOLN
4.0000 mg | Freq: Four times a day (QID) | INTRAMUSCULAR | Status: DC | PRN
  Administered 2021-11-11: 4 mg via INTRAVENOUS
  Filled 2021-11-10: qty 2

## 2021-11-10 MED ORDER — HYDRALAZINE HCL 20 MG/ML IJ SOLN
5.0000 mg | Freq: Four times a day (QID) | INTRAMUSCULAR | Status: DC | PRN
  Administered 2021-11-12 – 2021-11-14 (×2): 5 mg via INTRAVENOUS
  Filled 2021-11-10 (×2): qty 1

## 2021-11-10 MED ORDER — MIDAZOLAM HCL 2 MG/2ML IJ SOLN
2.0000 mg | Freq: Once | INTRAMUSCULAR | Status: DC
  Filled 2021-11-10: qty 2

## 2021-11-10 MED ORDER — IOHEXOL 300 MG/ML  SOLN
100.0000 mL | Freq: Once | INTRAMUSCULAR | Status: AC | PRN
  Administered 2021-11-10: 100 mL via INTRAVENOUS

## 2021-11-10 MED ORDER — PROCHLORPERAZINE EDISYLATE 10 MG/2ML IJ SOLN
10.0000 mg | Freq: Once | INTRAMUSCULAR | Status: AC
  Administered 2021-11-10: 10 mg via INTRAVENOUS
  Filled 2021-11-10: qty 2

## 2021-11-10 MED ORDER — HYDROMORPHONE HCL 1 MG/ML IJ SOLN
1.0000 mg | Freq: Once | INTRAMUSCULAR | Status: AC
  Administered 2021-11-10: 1 mg via INTRAVENOUS
  Filled 2021-11-10: qty 1

## 2021-11-10 MED ORDER — ONDANSETRON HCL 4 MG/2ML IJ SOLN
4.0000 mg | Freq: Once | INTRAMUSCULAR | Status: AC
  Administered 2021-11-10: 4 mg via INTRAVENOUS
  Filled 2021-11-10: qty 2

## 2021-11-10 MED ORDER — MORPHINE SULFATE (PF) 2 MG/ML IV SOLN
1.0000 mg | INTRAVENOUS | Status: DC | PRN
  Administered 2021-11-11: 1 mg via INTRAVENOUS
  Filled 2021-11-10: qty 1

## 2021-11-10 NOTE — ED Provider Notes (Signed)
[facility] EMERGENCY DEPARTMENT Provider Note   CSN: [id number] Arrival date & time: 11/10/21  1435     History  Chief Complaint  Patient presents with   Abdominal Pain    Lindsay Diaz is a 51 y.o. female.  51 yo F with a chief complaints of abdominal pain nausea and vomiting.  The patient has a ostomy insert having some swelling and pain around the site.  She has had no output to the ostomy since about 11 AM when the pain started.  She denies any fevers or chills.  Denies any suspicious food intake.  Denies trauma.   Abdominal Pain     Home Medications Prior to Admission medications   Medication Sig Start Date End Date Taking? Authorizing Provider  Acetaminophen (TYLENOL 8 HOUR PO) Take 325 mg by mouth.    [provider]  acyclovir (ZOVIRAX) 800 MG tablet Take 1 tablet (800 mg total) by mouth 3 (three) times daily. 05/21/19   Penumalli, Glenford Bayley, MD  atenolol (TENORMIN) 50 MG tablet Take 50 mg by mouth at bedtime.    [provider]  buPROPion (WELLBUTRIN SR) 150 MG 12 hr tablet Take 150 mg by mouth daily.    [provider]  citalopram (CELEXA) 40 MG tablet Take 40 mg by mouth daily.    [provider]  mesalamine (LIALDA) 1.2 g EC tablet Take 1.2 g by mouth 2 (two) times daily.    [provider]  oxyCODONE (ROXICODONE) 5 MG immediate release tablet Take 1 tablet (5 mg total) by mouth every 4 (four) hours as needed for severe pain. 03/19/21   Sabas Sous, MD  tamsulosin (FLOMAX) 0.4 MG CAPS capsule Take 1 capsule (0.4 mg total) by mouth daily. 03/19/21   Sabas Sous, MD  zolpidem (AMBIEN) 10 MG tablet Take 10 mg by mouth at bedtime.    [provider]      Allergies    Patient has no allergy information on record.    Review of Systems   Review of Systems  Gastrointestinal:  Positive for abdominal pain.   Physical Exam Updated Vital Signs BP (!) 146/72    Pulse 70    Temp 97.6 F (36.4 C) (Oral)     Resp 17    Ht 5\' 7"  (1.702 m)    Wt 127 kg    SpO2 96%    BMI 43.85 kg/m  Physical Exam Vitals and nursing note reviewed.  Constitutional:      General: She is not in acute distress.    Appearance: She is well-developed. She is not diaphoretic.  HENT:     Head: Normocephalic and atraumatic.  Eyes:     Pupils: Pupils are equal, round, and reactive to light.  Cardiovascular:     Rate and Rhythm: Normal rate and regular rhythm.     Heart sounds: No murmur heard.   No friction rub. No gallop.  Pulmonary:     Effort: Pulmonary effort is normal.     Breath sounds: No wheezing or rales.  Abdominal:     General: There is no distension.     Palpations: Abdomen is soft.     Tenderness: There is no abdominal tenderness.     Comments: Localized distention around her ostomy site.  This is somewhat reducible but difficult to identify it is completely reducible.  Tenderness at the site but no obvious tenderness with deep palpation in the other abdominal quadrants.  Musculoskeletal:  General: No tenderness.     Cervical back: Normal range of motion and neck supple.  Skin:    General: Skin is warm and dry.  Neurological:     Mental Status: She is alert and oriented to person, place, and time.  Psychiatric:        Behavior: Behavior normal.    ED Results / Procedures / Treatments   Labs (all labs ordered are listed, but only abnormal results are displayed) Labs Reviewed  COMPREHENSIVE METABOLIC PANEL - Abnormal; Notable for the following components:      Result Value   Glucose, Bld 128 (*)    Total Protein 8.2 (*)    All other components within normal limits  CBC - Abnormal; Notable for the following components:   Platelets 422 (*)    All other components within normal limits  RESP PANEL BY RT-PCR (FLU A&B, COVID) ARPGX2  LIPASE, BLOOD  URINALYSIS, ROUTINE W REFLEX MICROSCOPIC    EKG EKG Interpretation  Date/Time:  Saturday November 10 2021 15:01:08 EST Ventricular Rate:   84 PR Interval:  166 QRS Duration: 80 QT Interval:  404 QTC Calculation: 477 R Axis:   28 Text Interpretation: Normal sinus rhythm Normal ECG No old tracing to compare Confirmed by Melene Plan [phone]) on 11/10/2021 3:49:15 PM  Radiology CT ABDOMEN PELVIS W CONTRAST  Result Date: 11/10/2021 CLINICAL DATA:  Bowel obstruction suspected EXAM: CT ABDOMEN AND PELVIS WITH CONTRAST TECHNIQUE: Multidetector CT imaging of the abdomen and pelvis was performed using the standard protocol following bolus administration of intravenous contrast. RADIATION DOSE REDUCTION: This exam was performed according to the departmental dose-optimization program which includes automated exposure control, adjustment of the mA and/or kV according to patient size and/or use of iterative reconstruction technique. CONTRAST:  OMNIPAQUE IOHEXOL 300 MG/ML  SOLN COMPARISON:  March 18, 2021 FINDINGS: Lower chest: No acute abnormality. Hepatobiliary: Multiple cholelithiasis in a distended gallbladder without ancillary evidence of acute cholecystitis. Liver is unremarkable. Portal vein is patent. No intrahepatic or extrahepatic biliary ductal dilation. Pancreas: Unremarkable. No pancreatic ductal dilatation or surrounding inflammatory changes. Spleen: Normal in size without focal abnormality. Adrenals/Urinary Tract: Adrenal glands are unremarkable. Kidneys enhance symmetrically. No evidence of hydronephrosis. There are innumerable bilateral nonobstructing nephrolithiasis. Resolution of previously described RIGHT-sided hydronephrosis. Bladder is decompressed. Stomach/Bowel: Status post diverting ostomy in the LEFT hemiabdomen. Ostomy contains several loops of herniated small bowel as well as a small amount of fluid. Multiple of these loops of small bowel are dilated. Representative dilated loop of small bowel measures 3.4 cm. Transition point within the ostomy may be at a site of additional mesenteric defect (series 5, image 87; series 2,  image 44;). There are several decompressed loops of bowel within the superior aspect of the ostomy. Colon within the ostomy is decompressed. There is a loop of mildly dilated small bowel within the LEFT lower quadrant which measures 3.0 cm. Small hiatal hernia. Vascular/Lymphatic: No significant vascular findings are present. No enlarged abdominal or pelvic lymph nodes. Reproductive: Uterus and bilateral adnexa are unremarkable. Other: No free air. Musculoskeletal: Degenerative changes of the lumbar spine. IMPRESSION: 1. There is small bowel obstruction due to several loops of herniated small bowel within the LEFT abdominal ostomy. There is a small amount of fluid within the ostomy without definite evidence of ischemia or perforation. Electronically Signed   By: Meda Klinefelter M.D.   On: 11/10/2021 16:37    Procedures Hernia reduction  Date/Time: 11/10/2021 5:16 PM Performed by: Melene Plan, DO  Authorized by: Melene PlanFloyd, Cyenna Rebello, DO  Consent: Verbal consent obtained. Consent given by: patient Patient understanding: patient states understanding of the procedure being performed Imaging studies: imaging studies available Patient identity confirmed: verbally with patient Time out: Immediately prior to procedure a "time out" was called to verify the correct patient, procedure, equipment, support staff and site/side marked as required. Preparation: Patient was prepped and draped in the usual sterile fashion. Local anesthesia used: no  Anesthesia: Local anesthesia used: no  Sedation: Patient sedated: no  Patient tolerance: patient tolerated the procedure well with no immediate complications      Medications Ordered in ED Medications  midazolam (VERSED) injection 2 mg (has no administration in time range)  ondansetron (ZOFRAN) injection 4 mg (4 mg Intravenous Given 11/10/21 1458)  HYDROmorphone (DILAUDID) injection 0.5 mg (0.5 mg Intravenous Given 11/10/21 1601)  prochlorperazine (COMPAZINE) injection  10 mg (10 mg Intravenous Given 11/10/21 1603)  diphenhydrAMINE (BENADRYL) injection 25 mg (25 mg Intravenous Given 11/10/21 1602)  iohexol (OMNIPAQUE) 300 MG/ML solution 100 mL (100 mLs Intravenous Contrast Given 11/10/21 1607)    ED Course/ Medical Decision Making/ A&P                           Medical Decision Making Amount and/or Complexity of Data Reviewed Labs: ordered. Radiology: ordered.  Risk Prescription drug management.   51 yo F with a chief complaints of abdominal discomfort nausea and vomiting and swelling at her ostomy site.  I am concerned for small bowel obstruction.  I felt likely was caused by a hernia at the ostomy site.  I was able to partially reduce this area but patient with persistent symptoms.  CT scan with signs of a bowel obstruction with loops in that area.  I discussed with Dr. Cleophas DunkerWhitfield, general surgery.  Recommended NG tube medical admission.  Patient's lab work is resulted without concerning finding, no significant electrolyte abnormality no change to the LFTs or lipase.  No leukocytosis no anemia.  The patients results and plan were reviewed and discussed.   Any x-rays performed were independently reviewed by myself.   Differential diagnosis were considered with the presenting HPI.  Medications  midazolam (VERSED) injection 2 mg (has no administration in time range)  ondansetron (ZOFRAN) injection 4 mg (4 mg Intravenous Given 11/10/21 1458)  HYDROmorphone (DILAUDID) injection 0.5 mg (0.5 mg Intravenous Given 11/10/21 1601)  prochlorperazine (COMPAZINE) injection 10 mg (10 mg Intravenous Given 11/10/21 1603)  diphenhydrAMINE (BENADRYL) injection 25 mg (25 mg Intravenous Given 11/10/21 1602)  iohexol (OMNIPAQUE) 300 MG/ML solution 100 mL (100 mLs Intravenous Contrast Given 11/10/21 1607)    Vitals:   11/10/21 1445 11/10/21 1600 11/10/21 1630 11/10/21 1651  BP:  (!) 142/85  (!) 146/72  Pulse:  76 68 70  Resp:  18  17  Temp:      TempSrc:      SpO2:  100% 94%  96%  Weight: 127 kg     Height: 5\' 7"  (1.702 m)       Final diagnoses:  SBO (small bowel obstruction) (HCC)    Admission/ observation were discussed with the admitting physician, patient and/or family and they are comfortable with the plan.           Final Clinical Impression(s) / ED Diagnoses Final diagnoses:  SBO (small bowel obstruction) (HCC)    Rx / DC Orders ED Discharge Orders     None  Melene Plan, DO 11/10/21 1717

## 2021-11-10 NOTE — ED Triage Notes (Signed)
Pt arrives pov with c/o LLQ pain radiating to left flank with N/V, endorses ostomy, not passing stool since last night. Denies fever, or dysuria ?

## 2021-11-10 NOTE — Assessment & Plan Note (Addendum)
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 Johney Maine 11/13/21 ?CT abdomen significant for SBO with herniated small bowel.  ?General surgery consulted.  ?NG tube placed and patient made NPO. ?Due to no improvement General surgery recommended laparoscopic repair of the parastomal hernia. ?Tolerated procedure very well. ?Currently passing gas in the colostomy bag. ?Management per surgery. Plan is to advance diet and if tolerates soft diet tomorrow, pt will be able to go home. ?

## 2021-11-10 NOTE — ED Notes (Signed)
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 Area very distended. BS are noted. ED MD at bedside ?

## 2021-11-10 NOTE — Progress Notes (Signed)
Plan of Care Note for accepted transfer ? ? ?Patient: Lindsay Diaz MRN: [id number]   DOA: 11/10/2021 ? ?Facility requesting transfer: [facility] ?Requesting Provider: Dr Adela Lank ?Reason for transfer: SBO ?Facility course: 50yow PMH UC, s/p ostomy, presented w/ abd pain and decreased ostomy outpt. CT showed  ? ?1. There is small bowel obstruction due to several loops of ?herniated small bowel within the LEFT abdominal ostomy. There is a ?small amount of fluid within the ostomy without definite evidence of ?ischemia or perforation. ? ?EDP d/w Dr. Dwain Sarna who recommended NGT and admission. He will see in consult. ? ?Plan of care: ?The patient is accepted for admission to Med-surg  unit, at [facility]..  ? ?Notify Dr. Dwain Sarna on arrival. [facility] will admit. ? ?Author: ?Brendia Sacks, MD ?11/10/2021 ? ?Check [email or web address] for on-call coverage. ? ?Nursing staff, Please call [facility] Admits & Consults System-Wide number on Amion as soon as patient's arrival, so appropriate admitting provider can evaluate the pt. ?

## 2021-11-10 NOTE — ED Notes (Signed)
Patient transported to CT 

## 2021-11-10 NOTE — Assessment & Plan Note (Addendum)
Transient. Resolved. ?

## 2021-11-10 NOTE — Assessment & Plan Note (Addendum)
Noted  

## 2021-11-10 NOTE — Consult Note (Signed)
Reason for Consult:sbo, parastomal herna Referring Physician: Dr Jetty DuhamelFloyd  Lindsay Diaz is an 51 y.o. female.  HPI: [age] yof with what sounds like perforated left/sigmoid colon with colectomy/colostomy over [place] years ago [place].  This was from UC.  She still has remainder of colon. Had been on mesalamine but not since January-says she hasnt been back to get medication.  She has known she has parastomal hernia for long time.  No one ever has discussed more surgery with her. Today at 11 am began to have pain at stoma site, no output, came to outside er where she was found to have large parastomal  hernia with sbo.  She was transferred here. She has some output in bag and is not tender when I see her.   Past Medical History:  Diagnosis Date   Depression    Hypertension    Ulcerative colitis (HCC)     Past Surgical History:  Procedure Laterality Date   CATARACT EXTRACTION     right eye   CESAREAN SECTION     x3   osotmy    Colectomy/colostomy  History reviewed. No pertinent family history.  Social History:  reports that she has never smoked. She has never used smokeless tobacco. She reports that she does not currently use alcohol after a past usage of about 1.0 standard drink per week. She reports that she does not currently use drugs.  Allergies: Not on File  Medications: I have reviewed the patient's current medications. No current facility-administered medications on file prior to encounter.   Current Outpatient Medications on File Prior to Encounter  Medication Sig Dispense Refill   Acetaminophen (TYLENOL 8 HOUR PO) Take 325 mg by mouth.     acyclovir (ZOVIRAX) 800 MG tablet Take 1 tablet (800 mg total) by mouth 3 (three) times daily. 30 tablet 0   atenolol (TENORMIN) 50 MG tablet Take 50 mg by mouth at bedtime.     buPROPion (WELLBUTRIN SR) 150 MG 12 hr tablet Take 150 mg by mouth daily.     citalopram (CELEXA) 40 MG tablet Take 40 mg by mouth daily.     mesalamine (LIALDA) 1.2 g  EC tablet Take 1.2 g by mouth 2 (two) times daily.     oxyCODONE (ROXICODONE) 5 MG immediate release tablet Take 1 tablet (5 mg total) by mouth every 4 (four) hours as needed for severe pain. 8 tablet 0   tamsulosin (FLOMAX) 0.4 MG CAPS capsule Take 1 capsule (0.4 mg total) by mouth daily. 7 capsule 0   zolpidem (AMBIEN) 10 MG tablet Take 10 mg by mouth at bedtime.      Results for orders placed or performed during the hospital encounter of 11/10/21 (from the past 48 hour(s))  Lipase, blood     Status: None   Collection Time: 11/10/21  2:54 PM  Result Value Ref Range   Lipase 38 11 - 51 U/L    Comment: Performed at [facility], [address] [place], [place] [address]  Comprehensive metabolic panel     Status: Abnormal   Collection Time: 11/10/21  2:54 PM  Result Value Ref Range   Sodium 138 135 - 145 mmol/L   Potassium 4.0 3.5 - 5.1 mmol/L   Chloride 103 98 - 111 mmol/L   CO2 23 22 - 32 mmol/L   Glucose, Bld 128 (H) 70 - 99 mg/dL    Comment: Glucose reference range applies only to samples taken after fasting for at least  8 hours.   BUN 14 6 - 20 mg/dL   Creatinine, Ser 4.23 0.44 - 1.00 mg/dL   Calcium 9.6 8.9 - 53.6 mg/dL   Total Protein 8.2 (H) 6.5 - 8.1 g/dL   Albumin 4.2 3.5 - 5.0 g/dL   AST 24 15 - 41 U/L   ALT 20 0 - 44 U/L   Alkaline Phosphatase 71 38 - 126 U/L   Total Bilirubin 0.4 0.3 - 1.2 mg/dL   GFR, Estimated >14 >43 mL/min    Comment: (NOTE) Calculated using the CKD-EPI Creatinine Equation (2021)    Anion gap 12 5 - 15    Comment: Performed at [facility], [address] [place], [place] [address]  CBC     Status: Abnormal   Collection Time: 11/10/21  2:54 PM  Result Value Ref Range   WBC 9.9 4.0 - 10.5 K/uL   RBC 4.77 3.87 - 5.11 MIL/uL   Hemoglobin 13.5 12.0 - 15.0 g/dL   HCT 86.7 61.9 - 50.9 %   MCV 86.2 80.0 - 100.0 fL   MCH 28.3 26.0 - 34.0 pg   MCHC 32.8 30.0 - 36.0 g/dL   RDW 32.6 71.2 - 45.8 %   Platelets 422 (H) 150 -  400 K/uL   nRBC 0.0 0.0 - 0.2 %    Comment: Performed at [facility], [address] [place], [place] [address]  Resp Panel by RT-PCR (Flu A&B, Covid) Nasopharyngeal Swab     Status: None   Collection Time: 11/10/21  4:58 PM   Specimen: Nasopharyngeal Swab; Nasopharyngeal(NP) swabs in vial transport medium  Result Value Ref Range   SARS Coronavirus 2 by RT PCR NEGATIVE NEGATIVE    Comment: (NOTE) SARS-CoV-2 target nucleic acids are NOT DETECTED.  The SARS-CoV-2 RNA is generally detectable in upper respiratory specimens during the acute phase of infection. The lowest concentration of SARS-CoV-2 viral copies this assay can detect is 138 copies/mL. A negative result does not preclude SARS-Cov-2 infection and should not be used as the sole basis for treatment or other patient management decisions. A negative result may occur with  improper specimen collection/handling, submission of specimen other than nasopharyngeal swab, presence of viral mutation(s) within the areas targeted by this assay, and inadequate number of viral copies(<138 copies/mL). A negative result must be combined with clinical observations, patient history, and epidemiological information. The expected result is Negative.  Fact Sheet for Patients:  BloggerCourse.com  Fact Sheet for Healthcare Providers:  SeriousBroker.it  This test is no t yet approved or cleared by the [place] FDA and  has been authorized for detection and/or diagnosis of SARS-CoV-2 by FDA under an Emergency Use Authorization (EUA). This EUA will remain  in effect (meaning this test can be used) for the duration of the COVID-19 declaration under Section 564(b)(1) of the Act, 21 U.S.C.section 360bbb-3(b)(1), unless the authorization is terminated  or revoked sooner.       Influenza A by PCR NEGATIVE NEGATIVE   Influenza B by PCR NEGATIVE NEGATIVE    Comment: (NOTE) The Xpert  Xpress SARS-CoV-2/FLU/RSV plus assay is intended as an aid in the diagnosis of influenza from Nasopharyngeal swab specimens and should not be used as a sole basis for treatment. Nasal washings and aspirates are unacceptable for Xpert Xpress SARS-CoV-2/FLU/RSV testing.  Fact Sheet for Patients: BloggerCourse.com  Fact Sheet for Healthcare Providers: SeriousBroker.it  This test is not yet approved or cleared by the [place] and  has been authorized for detection and/or diagnosis of SARS-CoV-2 by FDA under an Emergency Use Authorization (EUA). This EUA will remain in effect (meaning this test can be used) for the duration of the COVID-19 declaration under Section 564(b)(1) of the Act, 21 U.S.C. section 360bbb-3(b)(1), unless the authorization is terminated or revoked.  Performed at [facility], [address] [place], [place] [address]   Urinalysis, Routine w reflex microscopic     Status: Abnormal   Collection Time: 11/10/21  5:32 PM  Result Value Ref Range   Color, Urine YELLOW YELLOW   APPearance CLEAR CLEAR   Specific Gravity, Urine 1.010 1.005 - 1.030   pH 7.0 5.0 - 8.0   Glucose, UA NEGATIVE NEGATIVE mg/dL   Hgb urine dipstick SMALL (A) NEGATIVE   Bilirubin Urine NEGATIVE NEGATIVE   Ketones, ur NEGATIVE NEGATIVE mg/dL   Protein, ur 30 (A) NEGATIVE mg/dL   Nitrite NEGATIVE NEGATIVE   Leukocytes,Ua NEGATIVE NEGATIVE    Comment: Performed at [facility], [address] [place], [place] [address]  Urinalysis, Microscopic (reflex)     Status: Abnormal   Collection Time: 11/10/21  5:32 PM  Result Value Ref Range   RBC / HPF 0-5 0 - 5 RBC/hpf   WBC, UA 0-5 0 - 5 WBC/hpf   Bacteria, UA MANY (A) NONE SEEN   Squamous Epithelial / LPF 0-5 0 - 5    Comment: Performed at [facility], [address] [place], [place] [address]    DG Abdomen 1 View  Result Date: 11/10/2021 CLINICAL  DATA:  NG tube placement. EXAM: ABDOMEN - 1 VIEW COMPARISON:  None. FINDINGS: NG tube with distal tip below the GE junction. Side port is at the level of the GE junction. Lung bases are clear. Paucity of bowel gas. IMPRESSION: 1. NG tube with distal tip below the GE junction but side port is at the level of GE junction, it could be advanced 4-5 cm. 2.  Lung bases are clear.  Paucity of bowel gas. Electronically Signed   By: Larose Hires D.O.   On: 11/10/2021 18:02   CT ABDOMEN PELVIS W CONTRAST  Result Date: 11/10/2021 CLINICAL DATA:  Bowel obstruction suspected EXAM: CT ABDOMEN AND PELVIS WITH CONTRAST TECHNIQUE: Multidetector CT imaging of the abdomen and pelvis was performed using the standard protocol following bolus administration of intravenous contrast. RADIATION DOSE REDUCTION: This exam was performed according to the departmental dose-optimization program which includes automated exposure control, adjustment of the mA and/or kV according to patient size and/or use of iterative reconstruction technique. CONTRAST:  OMNIPAQUE IOHEXOL 300 MG/ML  SOLN COMPARISON:  March 18, 2021 FINDINGS: Lower chest: No acute abnormality. Hepatobiliary: Multiple cholelithiasis in a distended gallbladder without ancillary evidence of acute cholecystitis. Liver is unremarkable. Portal vein is patent. No intrahepatic or extrahepatic biliary ductal dilation. Pancreas: Unremarkable. No pancreatic ductal dilatation or surrounding inflammatory changes. Spleen: Normal in size without focal abnormality. Adrenals/Urinary Tract: Adrenal glands are unremarkable. Kidneys enhance symmetrically. No evidence of hydronephrosis. There are innumerable bilateral nonobstructing nephrolithiasis. Resolution of previously described RIGHT-sided hydronephrosis. Bladder is decompressed. Stomach/Bowel: Status post diverting ostomy in the LEFT hemiabdomen. Ostomy contains several loops of herniated small bowel as well as a small amount of fluid.  Multiple of these loops of small bowel are dilated. Representative dilated loop of small bowel measures 3.4 cm. Transition point within the ostomy may be at a site of additional mesenteric defect (series 5,  image 87; series 2, image 44;). There are several decompressed loops of bowel within the superior aspect of the ostomy. Colon within the ostomy is decompressed. There is a loop of mildly dilated small bowel within the LEFT lower quadrant which measures 3.0 cm. Small hiatal hernia. Vascular/Lymphatic: No significant vascular findings are present. No enlarged abdominal or pelvic lymph nodes. Reproductive: Uterus and bilateral adnexa are unremarkable. Other: No free air. Musculoskeletal: Degenerative changes of the lumbar spine. IMPRESSION: 1. There is small bowel obstruction due to several loops of herniated small bowel within the LEFT abdominal ostomy. There is a small amount of fluid within the ostomy without definite evidence of ischemia or perforation. Electronically Signed   By: Meda Klinefelter M.D.   On: 11/10/2021 16:37    Review of Systems  Gastrointestinal:  Positive for abdominal distention, abdominal pain, constipation and nausea.  All other systems reviewed and are negative. Blood pressure 138/77, pulse 84, temperature 98.5 F (36.9 C), temperature source Oral, resp. rate 17, height 5\' 7"  (1.702 m), weight 127 kg, SpO2 94 %. Physical Exam Constitutional:      Appearance: She is well-developed. She is obese.  Eyes:     General: No scleral icterus. Cardiovascular:     Rate and Rhythm: Normal rate.  Pulmonary:     Effort: Pulmonary effort is normal.  Abdominal:     General: There is no distension.     Palpations: Abdomen is soft.     Tenderness: There is no abdominal tenderness.     Hernia: No hernia is present.    Neurological:     Mental Status: She is alert.    Assessment/Plan: Parastomal hernia, sbo -this is longstanding, by the time I see her she has output in bag, not  really anything in ng tube and is not tender -I think reasonable to treat conservatively  -pharm dvt prophylaxis fine with me -will do sbo protocol  I reviewed ED provider notes, last 24 h vitals and pain scores, last 24 h labs and trends, and last 24 h imaging results.  This care required moderate level of medical decision making.    11/10/2021, 9:34 PM

## 2021-11-10 NOTE — Subjective & Objective (Addendum)
Patient is a 51 year old female with a past medical history of ulcerative colitis, status post ostomy, hypertension, depression presented to the ED with abdominal pain, nausea, vomiting, and decreased ostomy output.  CT showed small bowel obstruction due to several loops of herniated small bowel within the left abdominal ostomy.  There is a small amount of fluid within the ostomy without definite evidence of ischemia or perforation.  ED physician discussed the case with Dr. Kavin Leech who recommended NG tube and admission.  He will consult.  Patient states today she had breakfast around 9 AM and around 11 AM she started experiencing 7 out of 10 intensity left-sided abdominal pain.  She also had multiple episodes of vomiting at home.  She did have stool in her ostomy bag this morning but since after her symptoms started, she has not had much output.  Denies prior history of bowel obstruction.  She has no other complaints.  Denies fevers, cough, shortness of breath, or chest pain.  Denies dysuria or urinary frequency/urgency.

## 2021-11-10 NOTE — Assessment & Plan Note (Addendum)
Uncontrolled  ?Patient is on atenolol as an outpatient which is held on admission secondary to SBO.  ?-IV hydralazine prn SBP >160 ?

## 2021-11-10 NOTE — H&P (Signed)
History and Physical    [name] DOB: 07-23-1971 DOA: 11/10/2021  DOS: the patient was seen and examined on 11/10/2021  PCP: [facility]   Patient coming from: [facility]  I have personally briefly reviewed patient's old Diaz records in [facility]  Patient is a 51 year old female with a past Diaz history of ulcerative colitis, status post ostomy, hypertension, depression presented to the ED with abdominal pain, nausea, vomiting, and decreased ostomy output.  CT showed small bowel obstruction due to several loops of herniated small bowel within the left abdominal ostomy.  There is a small amount of fluid within the ostomy without definite evidence of ischemia or perforation.  ED physician discussed the case with Dr. Pola Diaz who recommended NG tube and admission.  He will consult.  Patient states today she had breakfast around 9 AM and around 11 AM she started experiencing 7 out of 10 intensity left-sided abdominal pain.  She also had multiple episodes of vomiting at home.  She did have stool in her ostomy bag this morning but since after her symptoms started, she has not had much output.  Denies prior history of bowel obstruction.  She has no other complaints.  Denies fevers, cough, shortness of breath, or chest pain.  Denies dysuria or urinary frequency/urgency.   Review of Systems:  Review of Systems  All other systems reviewed and are negative.  Past Diaz History:  Diagnosis Date   Depression    Hypertension    Ulcerative colitis ([facility] Diaz)     Past Surgical History:  Procedure Laterality Date   CATARACT EXTRACTION     right eye   CESAREAN SECTION     x3   osotmy       reports that she has never smoked. She has never used smokeless tobacco. She reports that she does not currently use alcohol after a past usage of about 1.0 standard drink per week. She reports that she does not currently use drugs.  Not on  File  History reviewed. No pertinent family history.  Prior to Admission medications   Medication Sig Start Date End Date Taking? Authorizing Provider  Acetaminophen (TYLENOL 8 HOUR PO) Take 325 mg by mouth.    [provider]  acyclovir (ZOVIRAX) 800 MG tablet Take 1 tablet (800 mg total) by mouth 3 (three) times daily. 05/21/19   Penumalli, Earlean Polka, MD  atenolol (TENORMIN) 50 MG tablet Take 50 mg by mouth at bedtime.    [provider]  buPROPion (WELLBUTRIN SR) 150 MG 12 hr tablet Take 150 mg by mouth daily.    [provider]  citalopram (CELEXA) 40 MG tablet Take 40 mg by mouth daily.    [provider]  mesalamine (LIALDA) 1.2 g EC tablet Take 1.2 g by mouth 2 (two) times daily.    [provider]  oxyCODONE (ROXICODONE) 5 MG immediate release tablet Take 1 tablet (5 mg total) by mouth every 4 (four) hours as needed for severe pain. 03/19/21   Maudie Flakes, MD  tamsulosin (FLOMAX) 0.4 MG CAPS capsule Take 1 capsule (0.4 mg total) by mouth daily. 03/19/21   Maudie Flakes, MD  zolpidem (AMBIEN) 10 MG tablet Take 10 mg by mouth at bedtime.    [provider]    Physical Exam: Vitals:   11/10/21 2000 11/10/21 2001 11/10/21 2005 11/10/21 2046  BP: 121/77  (!) 144/79 138/77  Pulse: 72 80 86 84  Resp: 16  17  Temp:    98.5 F (36.9 C)  TempSrc:    Oral  SpO2: 94% 94% 93% 94%  Weight:      Height:        Physical Exam Vitals and nursing note reviewed.  Constitutional:      General: She is not in acute distress. HENT:     Head: Normocephalic and atraumatic.  Eyes:     Extraocular Movements: Extraocular movements intact.     Conjunctiva/sclera: Conjunctivae normal.  Cardiovascular:     Rate and Rhythm: Normal rate and regular rhythm.     Pulses: Normal pulses.  Pulmonary:     Effort: Pulmonary effort is normal. No respiratory distress.     Breath sounds: Normal breath sounds. No wheezing or rales.  Abdominal:      General: There is distension.     Palpations: Abdomen is soft.     Tenderness: There is no abdominal tenderness. There is no guarding or rebound.     Comments: Hypoactive bowel sounds  Musculoskeletal:        General: No swelling or tenderness.     Cervical back: Normal range of motion.  Skin:    General: Skin is warm and dry.  Neurological:     General: No focal deficit present.     Mental Status: She is alert and oriented to person, place, and time.     Labs on Admission: I have personally reviewed following labs and imaging studies  CBC: Recent Labs  Lab 11/10/21 1454  WBC 9.9  HGB 13.5  HCT 41.1  MCV 86.2  PLT [id number]*   Basic Metabolic Panel: Recent Labs  Lab 11/10/21 1454  NA 138  K 4.0  CL 103  CO2 23  GLUCOSE 128*  BUN 14  CREATININE 0.93  CALCIUM 9.6   GFR: Estimated Creatinine Clearance: 98.1 mL/min (by C-G formula based on SCr of 0.93 mg/dL). Liver Function Tests: Recent Labs  Lab 11/10/21 1454  AST 24  ALT 20  ALKPHOS 71  BILITOT 0.4  PROT 8.2*  ALBUMIN 4.2   Recent Labs  Lab 11/10/21 1454  LIPASE 38   No results for input(s): AMMONIA in the last 168 hours. Coagulation Profile: No results for input(s): INR, PROTIME in the last 168 hours. Cardiac Enzymes: No results for input(s): CKTOTAL, CKMB, CKMBINDEX, TROPONINI in the last 168 hours. BNP (last 3 results) No results for input(s): PROBNP in the last 8760 hours. HbA1C: No results for input(s): HGBA1C in the last 72 hours. CBG: No results for input(s): GLUCAP in the last 168 hours. Lipid Profile: No results for input(s): CHOL, HDL, LDLCALC, TRIG, CHOLHDL, LDLDIRECT in the last 72 hours. Thyroid Function Tests: No results for input(s): TSH, T4TOTAL, FREET4, T3FREE, THYROIDAB in the last 72 hours. Anemia Panel: No results for input(s): VITAMINB12, FOLATE, FERRITIN, TIBC, IRON, RETICCTPCT in the last 72 hours. Urine analysis:    Component Value Date/Time   COLORURINE YELLOW 11/10/2021  1732   APPEARANCEUR CLEAR 11/10/2021 1732   LABSPEC 1.010 11/10/2021 1732   PHURINE 7.0 11/10/2021 1732   GLUCOSEU NEGATIVE 11/10/2021 1732   HGBUR SMALL (A) 11/10/2021 1732   BILIRUBINUR NEGATIVE 11/10/2021 1732   KETONESUR NEGATIVE 11/10/2021 1732   PROTEINUR 30 (A) 11/10/2021 1732   NITRITE NEGATIVE 11/10/2021 1732   LEUKOCYTESUR NEGATIVE 11/10/2021 1732    Radiological Exams on Admission: I have personally reviewed images DG Abdomen 1 View  Result Date: 11/10/2021 CLINICAL DATA:  NG tube placement. EXAM: ABDOMEN -  1 VIEW COMPARISON:  None. FINDINGS: NG tube with distal tip below the GE junction. Side port is at the level of the GE junction. Lung bases are clear. Paucity of bowel gas. IMPRESSION: 1. NG tube with distal tip below the GE junction but side port is at the level of GE junction, it could be advanced 4-5 cm. 2.  Lung bases are clear.  Paucity of bowel gas. Electronically Signed   By: Keane Police D.O.   On: 11/10/2021 18:02   CT ABDOMEN PELVIS W CONTRAST  Result Date: 11/10/2021 CLINICAL DATA:  Bowel obstruction suspected EXAM: CT ABDOMEN AND PELVIS WITH CONTRAST TECHNIQUE: Multidetector CT imaging of the abdomen and pelvis was performed using the standard protocol following bolus administration of intravenous contrast. RADIATION DOSE REDUCTION: This exam was performed according to the departmental dose-optimization program which includes automated exposure control, adjustment of the mA and/or kV according to patient size and/or use of iterative reconstruction technique. CONTRAST:  11mL OMNIPAQUE IOHEXOL 300 MG/ML  SOLN COMPARISON:  March 18, 2021 FINDINGS: Lower chest: No acute abnormality. Hepatobiliary: Multiple cholelithiasis in a distended gallbladder without ancillary evidence of acute cholecystitis. Liver is unremarkable. Portal vein is patent. No intrahepatic or extrahepatic biliary ductal dilation. Pancreas: Unremarkable. No pancreatic ductal dilatation or surrounding  inflammatory changes. Spleen: Normal in size without focal abnormality. Adrenals/Urinary Tract: Adrenal glands are unremarkable. Kidneys enhance symmetrically. No evidence of hydronephrosis. There are innumerable bilateral nonobstructing nephrolithiasis. Resolution of previously described RIGHT-sided hydronephrosis. Bladder is decompressed. Stomach/Bowel: Status post diverting ostomy in the LEFT hemiabdomen. Ostomy contains several loops of herniated small bowel as well as a small amount of fluid. Multiple of these loops of small bowel are dilated. Representative dilated loop of small bowel measures 3.4 cm. Transition point within the ostomy may be at a site of additional mesenteric defect (series 5, image 87; series 2, image 44;). There are several decompressed loops of bowel within the superior aspect of the ostomy. Colon within the ostomy is decompressed. There is a loop of mildly dilated small bowel within the LEFT lower quadrant which measures 3.0 cm. Small hiatal hernia. Vascular/Lymphatic: No significant vascular findings are present. No enlarged abdominal or pelvic lymph nodes. Reproductive: Uterus and bilateral adnexa are unremarkable. Other: No free air. Musculoskeletal: Degenerative changes of the lumbar spine. IMPRESSION: 1. There is small bowel obstruction due to several loops of herniated small bowel within the LEFT abdominal ostomy. There is a small amount of fluid within the ostomy without definite evidence of ischemia or perforation. Electronically Signed   By: Valentino Saxon M.D.   On: 11/10/2021 16:37    EKG: I have personally reviewed EKG: Sinus rhythm, no prior tracings for comparison.  Assessment/Plan Principal Problem:   SBO (small bowel obstruction) (HCC) Active Problems:   HTN (hypertension)   Bacteriuria   Thrombocytosis    Assessment and Plan: * SBO (small bowel obstruction) (HCC) CT showed small bowel obstruction due to several loops of herniated small bowel within the  left abdominal ostomy.  There is a small amount of fluid within the ostomy without definite evidence of ischemia or perforation. -Bowel rest, NG tube placed in the ED.  IV fluid hydration, morphine as needed for pain, antiemetic as needed.  Monitor electrolytes.  General surgery consulted.  Thrombocytosis Mild, platelet count 422k. -Repeat CBC in a.m.  Bacteriuria UA with negative nitrite, negative leukocytes, 0-5 WBCs, and many bacteria.  Patient is not endorsing any UTI symptoms.  HTN (hypertension) Stable, currently normotensive. -IV hydralazine  prn SBP >160   DVT prophylaxis: SCDs Code Status: Full Code (discussed with the patient) Family Communication: Son at bedside. Admission status: Inpatient, Med-Surg It is my clinical opinion that admission to INPATIENT is reasonable and necessary because of the expectation that this patient will require hospital care that crosses at least 2 midnights to treat this condition based on the Diaz complexity of the problems presented.  Given the aforementioned information, the predictability of an adverse outcome is felt to be significant.   Shela Leff, MD [facility] 11/10/2021, 9:36 PM

## 2021-11-10 NOTE — ED Notes (Signed)
[facility] at bedside. Pt stable for transport. 

## 2021-11-11 ENCOUNTER — Encounter (HOSPITAL_COMMUNITY): Payer: Self-pay | Admitting: Family Medicine

## 2021-11-11 ENCOUNTER — Inpatient Hospital Stay (HOSPITAL_COMMUNITY): Payer: BC Managed Care – PPO

## 2021-11-11 DIAGNOSIS — D75839 Thrombocytosis, unspecified: Secondary | ICD-10-CM

## 2021-11-11 DIAGNOSIS — R8271 Bacteriuria: Secondary | ICD-10-CM | POA: Diagnosis not present

## 2021-11-11 DIAGNOSIS — K631 Perforation of intestine (nontraumatic): Secondary | ICD-10-CM

## 2021-11-11 DIAGNOSIS — K5190 Ulcerative colitis, unspecified, without complications: Secondary | ICD-10-CM | POA: Diagnosis present

## 2021-11-11 DIAGNOSIS — K433 Parastomal hernia with obstruction, without gangrene: Secondary | ICD-10-CM | POA: Diagnosis not present

## 2021-11-11 DIAGNOSIS — K56609 Unspecified intestinal obstruction, unspecified as to partial versus complete obstruction: Secondary | ICD-10-CM | POA: Diagnosis not present

## 2021-11-11 LAB — BASIC METABOLIC PANEL
Anion gap: 10 (ref 5–15)
BUN: 14 mg/dL (ref 6–20)
CO2: 24 mmol/L (ref 22–32)
Calcium: 9.3 mg/dL (ref 8.9–10.3)
Chloride: 104 mmol/L (ref 98–111)
Creatinine, Ser: 0.75 mg/dL (ref 0.44–1.00)
GFR, Estimated: >60 mL/min (ref >60)
Glucose, Bld: 154 mg/dL — ABNORMAL HIGH (ref 70–99)
Potassium: 3.8 mmol/L (ref 3.5–5.1)
Sodium: 138 mmol/L (ref 135–145)

## 2021-11-11 LAB — CBC
HCT: 39.3 % (ref 36.0–46.0)
Hemoglobin: 12.7 g/dL (ref 12.0–15.0)
MCH: 28.5 pg (ref 26.0–34.0)
MCHC: 32.3 g/dL (ref 30.0–36.0)
MCV: 88.3 fL (ref 80.0–100.0)
Platelets: 362 10*3/uL (ref 150–400)
RBC: 4.45 MIL/uL (ref 3.87–5.11)
RDW: 13.2 % (ref 11.5–15.5)
WBC: 9.6 10*3/uL (ref 4.0–10.5)
nRBC: 0 % (ref 0.0–0.2)

## 2021-11-11 LAB — HIV ANTIBODY (ROUTINE TESTING W REFLEX): HIV Screen 4th Generation wRfx: NONREACTIVE

## 2021-11-11 MED ORDER — PROCHLORPERAZINE EDISYLATE 10 MG/2ML IJ SOLN
5.0000 mg | INTRAMUSCULAR | Status: DC | PRN
  Administered 2021-11-11 – 2021-11-13 (×4): 10 mg via INTRAVENOUS
  Filled 2021-11-11 (×4): qty 2

## 2021-11-11 MED ORDER — METOCLOPRAMIDE HCL 5 MG/ML IJ SOLN: 5.0000 mg | Freq: Three times a day (TID) | INTRAMUSCULAR | Status: DC | PRN

## 2021-11-11 MED ORDER — ALUM & MAG HYDROXIDE-SIMETH 200-200-20 MG/5ML PO SUSP
30.0000 mL | Freq: Four times a day (QID) | ORAL | Status: DC | PRN
Start: 2021-11-11 — End: 2021-11-17

## 2021-11-11 MED ORDER — SIMETHICONE 40 MG/0.6ML PO SUSP
80.0000 mg | Freq: Four times a day (QID) | ORAL | Status: DC | PRN
Start: 2021-11-11 — End: 2021-11-17
  Filled 2021-11-11: qty 1.2

## 2021-11-11 MED ORDER — MAGIC MOUTHWASH
15.0000 mL | Freq: Four times a day (QID) | ORAL | Status: DC | PRN
Start: 2021-11-11 — End: 2021-11-17
  Filled 2021-11-11: qty 15

## 2021-11-11 MED ORDER — PHENOL 1.4 % MT LIQD
2.0000 | OROMUCOSAL | Status: DC | PRN
Start: 2021-11-11 — End: 2021-11-17
  Filled 2021-11-11: qty 177

## 2021-11-11 MED ORDER — DIPHENHYDRAMINE HCL 50 MG/ML IJ SOLN
12.5000 mg | Freq: Four times a day (QID) | INTRAMUSCULAR | Status: DC | PRN
  Administered 2021-11-11 – 2021-11-15 (×5): 25 mg via INTRAVENOUS
  Filled 2021-11-11 (×5): qty 1

## 2021-11-11 MED ORDER — MENTHOL 3 MG MT LOZG
1.0000 | LOZENGE | OROMUCOSAL | Status: DC | PRN
Start: 2021-11-11 — End: 2021-11-17

## 2021-11-11 MED ORDER — METHOCARBAMOL 1000 MG/10ML IJ SOLN
1000.0000 mg | Freq: Four times a day (QID) | INTRAMUSCULAR | Status: DC | PRN
  Filled 2021-11-11 (×2): qty 10

## 2021-11-11 MED ORDER — SODIUM CHLORIDE 0.45 % IV SOLN: INTRAVENOUS | Status: DC

## 2021-11-11 MED ORDER — ONDANSETRON HCL 40 MG/20ML IJ SOLN
8.0000 mg | Freq: Four times a day (QID) | INTRAMUSCULAR | Status: DC | PRN
Start: 2021-11-11 — End: 2021-11-12
  Filled 2021-11-11 (×2): qty 4

## 2021-11-11 MED ORDER — SODIUM CHLORIDE 0.9 % IV SOLN: 8.0000 mg | Freq: Four times a day (QID) | INTRAVENOUS | Status: DC | PRN

## 2021-11-11 MED ORDER — METOCLOPRAMIDE HCL 5 MG/ML IJ SOLN
10.0000 mg | Freq: Three times a day (TID) | INTRAMUSCULAR | Status: AC
  Administered 2021-11-11 – 2021-11-13 (×5): 10 mg via INTRAVENOUS
  Filled 2021-11-11 (×5): qty 2

## 2021-11-11 MED ORDER — LIP MEDEX EX OINT
1.0000 "application " | TOPICAL_OINTMENT | Freq: Two times a day (BID) | CUTANEOUS | Status: DC
  Administered 2021-11-11 – 2021-11-17 (×12): 1 via TOPICAL
  Filled 2021-11-11: qty 7

## 2021-11-11 NOTE — Progress Notes (Signed)
Noted pt has had only 50 cc's of output from NG since 0700. Unable to verify placement via rapid air bolus. Reviewed previous xray results post NGT insertion and advanced NGT approx 4-6 cm. Tube retaped, placement now verified via air bolus, tube reconnected to LWIS. Pt tol well, will cont to monitor. ?

## 2021-11-11 NOTE — Assessment & Plan Note (Deleted)
CT abdomen significant for SBO with herniated small bowel. General surgery consulted.  ?

## 2021-11-11 NOTE — Progress Notes (Signed)
Called to rm to ck on pt, had vomited approx 50 cc's greenish-tan material. Pt refused Zofran so Compazine given. Noted NGT draining same as emesis w clumps of sediment noted - ?? Partially digested food?? MD notified and orders received. Will cont to monitor. ?

## 2021-11-11 NOTE — Assessment & Plan Note (Addendum)
Body mass index is 41.09 kg/m?Marland Kitchen  ?Placing the pt at higher risk of poor outcomes. ?

## 2021-11-11 NOTE — Progress Notes (Signed)
PROGRESS NOTE    Lindsay Diaz  OZD:[id number] DOB: [birth year] DOA: 11/10/2021 PCP: [facility]   Brief Narrative: Lindsay Diaz is a 51 y.o. female with a history of ulcerative colitis s/p colostomy, hypertension, depression. Patient presented secondary to abdominal pain, nausea, vomiting and decreased ostomy output. CT scan significant for small bowel obstruction. General surgery consulted. NG tube placed. Bowel rest.   Assessment and Plan: * SBO (small bowel obstruction) (HCC) CT abdomen significant for SBO with herniated small bowel. General surgery consulted. NG tube placed and patient made NPO. Bowel rest. -General surgery recommendations: SBO protocol -Resume IV fluids  Morbid obesity with body mass index (BMI) of 40.0 to 49.9 (HCC) Body mass index is 43.85 kg/m.  Asymptomatic bacteriuria Noted.  HTN (hypertension) Patient is on atenolol as an outpatient which is held on admission secondary to SBO.  -IV hydralazine prn SBP >160  Thrombocytosis-resolved as of 11/11/2021 Transient. Resolved.    DVT prophylaxis: SCDs Code Status:   Code Status: Full Code Family Communication: Son at bedside Disposition Plan: Discharge home likely in 3-5 days pending improvement of SBO vs general surgery recommendations   Consultants:  General surgery  Procedures:  NG Tube  Antimicrobials: None    Subjective: Patient reports abdominal pain and nausea. No significant ostomy output.  Objective: BP (!) 174/90 (BP Location: Right Arm)    Pulse 86    Temp (!) 97.5 F (36.4 C) (Axillary)    Resp 18    Ht 5\' 7"  (1.702 m)    Wt 127 kg    SpO2 94%    BMI 43.85 kg/m   Examination:  General exam: Appears calm and comfortable Respiratory system: Clear to auscultation. Respiratory effort normal. Cardiovascular system: S1 & S2 heard, RRR. No murmurs, rubs, gallops or clicks. Gastrointestinal system: Abdomen is nondistended, soft and no significant  tenderness. Normal bowel sounds heard. Central nervous system: Alert and oriented. No focal neurological deficits. Musculoskeletal: No calf tenderness Skin: No cyanosis. No rashes Psychiatry: Judgement and insight appear normal. Mood & affect appropriate.    Data Reviewed: I have personally reviewed following labs and imaging studies  CBC Lab Results  Component Value Date   WBC 9.6 11/11/2021   RBC 4.45 11/11/2021   HGB 12.7 11/11/2021   HCT 39.3 11/11/2021   MCV 88.3 11/11/2021   MCH 28.5 11/11/2021   PLT 362 11/11/2021   MCHC 32.3 11/11/2021   RDW 13.2 11/11/2021   LYMPHSABS 1.4 03/18/2021   MONOABS 0.6 03/18/2021   EOSABS 0.2 03/18/2021   BASOSABS 0.0 03/18/2021     Last metabolic panel Lab Results  Component Value Date   NA 138 11/11/2021   K 3.8 11/11/2021   CL 104 11/11/2021   CO2 24 11/11/2021   BUN 14 11/11/2021   CREATININE 0.75 11/11/2021   GLUCOSE 154 (H) 11/11/2021   GFRNONAA >60 11/11/2021   GFRAA 91 05/20/2019   CALCIUM 9.3 11/11/2021   PROT 8.2 (H) 11/10/2021   ALBUMIN 4.2 11/10/2021   LABGLOB 2.8 05/20/2019   AGRATIO 1.4 05/20/2019   BILITOT 0.4 11/10/2021   ALKPHOS 71 11/10/2021   AST 24 11/10/2021   ALT 20 11/10/2021   ANIONGAP 10 11/11/2021    GFR: Estimated Creatinine Clearance: 114 mL/min (by C-G formula based on SCr of 0.75 mg/dL).  Recent Results (from the past 240 hour(s))  Resp Panel by RT-PCR (Flu A&B, Covid) Nasopharyngeal Swab     Status: None   Collection Time: 11/10/21  4:58 PM   Specimen: Nasopharyngeal Swab; Nasopharyngeal(NP) swabs in vial transport medium  Result Value Ref Range Status   SARS Coronavirus 2 by RT PCR NEGATIVE NEGATIVE Final    Comment: (NOTE) SARS-CoV-2 target nucleic acids are NOT DETECTED.  The SARS-CoV-2 RNA is generally detectable in upper respiratory specimens during the acute phase of infection. The lowest concentration of SARS-CoV-2 viral copies this assay can detect is 138 copies/mL. A  negative result does not preclude SARS-Cov-2 infection and should not be used as the sole basis for treatment or other patient management decisions. A negative result may occur with  improper specimen collection/handling, submission of specimen other than nasopharyngeal swab, presence of viral mutation(s) within the areas targeted by this assay, and inadequate number of viral copies(<138 copies/mL). A negative result must be combined with clinical observations, patient history, and epidemiological information. The expected result is Negative.  Fact Sheet for Patients:  BloggerCourse.com  Fact Sheet for Healthcare Providers:  SeriousBroker.it  This test is no t yet approved or cleared by the [place] FDA and  has been authorized for detection and/or diagnosis of SARS-CoV-2 by FDA under an Emergency Use Authorization (EUA). This EUA will remain  in effect (meaning this test can be used) for the duration of the COVID-19 declaration under Section 564(b)(1) of the Act, 21 U.S.C.section 360bbb-3(b)(1), unless the authorization is terminated  or revoked sooner.       Influenza A by PCR NEGATIVE NEGATIVE Final   Influenza B by PCR NEGATIVE NEGATIVE Final    Comment: (NOTE) The Xpert Xpress SARS-CoV-2/FLU/RSV plus assay is intended as an aid in the diagnosis of influenza from Nasopharyngeal swab specimens and should not be used as a sole basis for treatment. Nasal washings and aspirates are unacceptable for Xpert Xpress SARS-CoV-2/FLU/RSV testing.  Fact Sheet for Patients: BloggerCourse.com  Fact Sheet for Healthcare Providers: SeriousBroker.it  This test is not yet approved or cleared by the [place] FDA and has been authorized for detection and/or diagnosis of SARS-CoV-2 by FDA under an Emergency Use Authorization (EUA). This EUA will remain in effect (meaning this test can  be used) for the duration of the COVID-19 declaration under Section 564(b)(1) of the Act, 21 U.S.C. section 360bbb-3(b)(1), unless the authorization is terminated or revoked.  Performed at [facility], [address] [place], [place] [address]       Radiology Studies: DG Abdomen 1 View  Result Date: 11/10/2021 CLINICAL DATA:  NG tube placement. EXAM: ABDOMEN - 1 VIEW COMPARISON:  None. FINDINGS: NG tube with distal tip below the GE junction. Side port is at the level of the GE junction. Lung bases are clear. Paucity of bowel gas. IMPRESSION: 1. NG tube with distal tip below the GE junction but side port is at the level of GE junction, it could be advanced 4-5 cm. 2.  Lung bases are clear.  Paucity of bowel gas. Electronically Signed   By: Larose Hires D.O.   On: 11/10/2021 18:02   CT ABDOMEN PELVIS W CONTRAST  Result Date: 11/10/2021 CLINICAL DATA:  Bowel obstruction suspected EXAM: CT ABDOMEN AND PELVIS WITH CONTRAST TECHNIQUE: Multidetector CT imaging of the abdomen and pelvis was performed using the standard protocol following bolus administration of intravenous contrast. RADIATION DOSE REDUCTION: This exam was performed according to the departmental dose-optimization program which includes automated exposure control, adjustment of the mA and/or kV according to patient size and/or use of iterative reconstruction technique. CONTRAST:  OMNIPAQUE IOHEXOL 300  MG/ML  SOLN COMPARISON:  March 18, 2021 FINDINGS: Lower chest: No acute abnormality. Hepatobiliary: Multiple cholelithiasis in a distended gallbladder without ancillary evidence of acute cholecystitis. Liver is unremarkable. Portal vein is patent. No intrahepatic or extrahepatic biliary ductal dilation. Pancreas: Unremarkable. No pancreatic ductal dilatation or surrounding inflammatory changes. Spleen: Normal in size without focal abnormality. Adrenals/Urinary Tract: Adrenal glands are unremarkable. Kidneys enhance  symmetrically. No evidence of hydronephrosis. There are innumerable bilateral nonobstructing nephrolithiasis. Resolution of previously described RIGHT-sided hydronephrosis. Bladder is decompressed. Stomach/Bowel: Status post diverting ostomy in the LEFT hemiabdomen. Ostomy contains several loops of herniated small bowel as well as a small amount of fluid. Multiple of these loops of small bowel are dilated. Representative dilated loop of small bowel measures 3.4 cm. Transition point within the ostomy may be at a site of additional mesenteric defect (series 5, image 87; series 2, image 44;). There are several decompressed loops of bowel within the superior aspect of the ostomy. Colon within the ostomy is decompressed. There is a loop of mildly dilated small bowel within the LEFT lower quadrant which measures 3.0 cm. Small hiatal hernia. Vascular/Lymphatic: No significant vascular findings are present. No enlarged abdominal or pelvic lymph nodes. Reproductive: Uterus and bilateral adnexa are unremarkable. Other: No free air. Musculoskeletal: Degenerative changes of the lumbar spine. IMPRESSION: 1. There is small bowel obstruction due to several loops of herniated small bowel within the LEFT abdominal ostomy. There is a small amount of fluid within the ostomy without definite evidence of ischemia or perforation. Electronically Signed   By: Meda Klinefelter M.D.   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 The loops of bowel are filled with contrast concerning for small bowel obstruction. No evidence of pneumoperitoneum, portal venous gas or pneumatosis. No pathologic calcifications along the  expected course of the ureters. No acute osseous abnormality. IMPRESSION: 1. 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 The loops of bowel are filled with contrast concerning for small bowel obstruction. Electronically Signed   By: Elige Ko M.D.   On: 11/11/2021 10:16      LOS: 1 day    Jacquelin Hawking, MD [facility] 11/11/2021, 12:53 PM   If 7PM-7AM, please contact night-coverage [email or web address]

## 2021-11-11 NOTE — Progress Notes (Addendum)
Lindsay Diaz [id number] Mar 11, 1971  CARE TEAM:  PCP: [facility]  Outpatient Care Team: Patient Care Team: [facility] as PCP - General (Family Medicine)  Inpatient Treatment Team: Treatment Team: Attending Provider: Narda Bonds, MD; Charge Nurse: Clarita Leber, RN; Consulting Physician: Montez Morita, Md, MD; Rounding Team: Lilyan Gilford, MD; Student Nurse: Roselee Nova, Lemar Livings, Student-RN   Problem List:   Principal Problem:   SBO (small bowel obstruction) (HCC) Active Problems:   HTN (hypertension)   Bacteriuria   Thrombocytosis      * No surgery found *      Assessment  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  Small bowel protocol.  Improved nausea control.  Double ondansetron.  Add Compazine and Reglan.  Try and mobilize.  I think it would be wise for her to follow-up with her gastroenterologist to see what is the plan to see if she has an option to get her colostomy taken down.  Perhaps she would need an abdominal colectomy given her obesity other issues she may not be a great candidate for pouch.  Not certain.  She gets her care in [place] through the [facility] so did will defer back to them what they think.  Surgery if worse or does not open up - minimal discomfort with soft hernia and some gas/liquid stool are guardedly hopeful signs  The patient is stable.  There is no evidence of peritonitis, acute abdomen, nor shock.  There is no strong evidence of failure of improvement nor decline with current non-operative management.  There is no need for surgery at the present moment.   We will continue to follow.  VTE prophylaxis- SCDs, etc  mobilize as tolerated to help recovery  I updated the patient's status to the  patient, her son, and her nurse Gunnar Fusi.   Recommendations were  made.  Questions were answered.  They expressed understanding & appreciation.   Disposition: Per primary service   I reviewed nursing notes, ED provider notes, hospitalist notes, last 24 h vitals and pain scores, last 48 h intake and output, last 24 h labs and trends, and last 24 h imaging results. I have reviewed this patient's available data, including medical history, events of note, test results, etc as part of my evaluation.  A significant portion of that time was spent in counseling.  Care during the described time interval was provided by me.  This care required moderate level of medical decision making.  11/11/2021    Subjective: (Chief complaint)  Having less abdominal pain.  Walking in the bathroom.  Still with nausea.  Zofran not controlling well.  Objective:  Vital signs:  Vitals:   11/10/21 2046 11/11/21 0053 11/11/21 0559 11/11/21 0650  BP: 138/77 (!) 146/77 (!) 173/94 (!) 174/90  Pulse: 84 91 99 86  Resp: 17 16 18    Temp: 98.5 F (36.9 C) 98.7 F (37.1 C) (!) 97.5 F (36.4 C)   TempSrc: Oral Oral Axillary   SpO2: 94% 91% 94%   Weight:      Height:        Last BM Date : 11/09/21  Intake/Output   Yesterday:  03/04 0701 - 03/05 0700 In: 478.2 [I.V.:478.2] Out: 200 [Emesis/NG output:200] This shift:  No intake/output data recorded.  Bowel function:  Flatus: YES  BM:  YES  Drain: Bilious   Physical Exam:  General: Pt awake/alert in no acute distress Eyes: PERRL, normal EOM.  Sclera clear.  No icterus Neuro: CN II-XII intact w/o focal sensory/motor deficits. Lymph: No head/neck/groin lymphadenopathy Psych:  No delerium/psychosis/paranoia.  Oriented x 4 HENT: Normocephalic, Mucus membranes moist.  No thrush Neck: Supple, No tracheal deviation.  No obvious thyromegaly Chest: No pain to chest wall compression.  Good respiratory excursion.  No audible wheezing CV:  Pulses intact.  Regular rhythm.  No major extremity edema MS: Normal AROM mjr  joints.  No obvious deformity  Abdomen: Morbidly obese.  Large left lateral parastomal hernia around colostomy.  However it is soft and mostly reducible.  There is some gas and thin stool in the bag.  Not a large volume.  No guarding or peritonitis  Soft.  Nondistended.  Nontender.  No evidence of peritonitis.  No incarcerated hernias.  Ext:   No deformity.  No mjr edema.  No cyanosis Skin: No petechiae / purpurea.  No major sores.  Warm and dry    Results:   Cultures: Recent Results (from the past 720 hour(s))  Resp Panel by RT-PCR (Flu A&B, Covid) Nasopharyngeal Swab     Status: None   Collection Time: 11/10/21  4:58 PM   Specimen: Nasopharyngeal Swab; Nasopharyngeal(NP) swabs in vial transport medium  Result Value Ref Range Status   SARS Coronavirus 2 by RT PCR NEGATIVE NEGATIVE Final    Comment: (NOTE) SARS-CoV-2 target nucleic acids are NOT DETECTED.  The SARS-CoV-2 RNA is generally detectable in upper respiratory specimens during the acute phase of infection. The lowest concentration of SARS-CoV-2 viral copies this assay can detect is 138 copies/mL. A negative result does not preclude SARS-Cov-2 infection and should not be used as the sole basis for treatment or other patient management decisions. A negative result may occur with  improper specimen collection/handling, submission of specimen other than nasopharyngeal swab, presence of viral mutation(s) within the areas targeted by this assay, and inadequate number of viral copies(<138 copies/mL). A negative result must be combined with clinical observations, patient history, and epidemiological information. The expected result is Negative.  Fact Sheet for Patients:  BloggerCourse.comhttps://www.fda.gov/media/152166/download  Fact Sheet for Healthcare Providers:  SeriousBroker.ithttps://www.fda.gov/media/152162/download  This test is no t yet approved or cleared by the [place] FDA and  has been authorized for detection and/or diagnosis of  SARS-CoV-2 by FDA under an Emergency Use Authorization (EUA). This EUA will remain  in effect (meaning this test can be used) for the duration of the COVID-19 declaration under Section 564(b)(1) of the Act, 21 U.S.C.section 360bbb-3(b)(1), unless the authorization is terminated  or revoked sooner.       Influenza A by PCR NEGATIVE NEGATIVE Final   Influenza B by PCR NEGATIVE NEGATIVE Final    Comment: (NOTE) The Xpert Xpress SARS-CoV-2/FLU/RSV plus assay is intended as an aid in the diagnosis of influenza from Nasopharyngeal swab specimens and should not be used as a sole basis for treatment. Nasal washings and aspirates are unacceptable for Xpert Xpress SARS-CoV-2/FLU/RSV testing.  Fact Sheet for Patients: BloggerCourse.comhttps://www.fda.gov/media/152166/download  Fact Sheet for Healthcare Providers: SeriousBroker.ithttps://www.fda.gov/media/152162/download  This test is not yet approved or cleared by the [place] FDA and has been authorized for detection and/or diagnosis of SARS-CoV-2 by FDA under an Emergency Use Authorization (EUA). This EUA will remain in effect (meaning this test can be used) for the duration of the COVID-19 declaration under Section 564(b)(1) of the Act, 21 U.S.C. section 360bbb-3(b)(1), unless the authorization is terminated or revoked.  Performed  at [facility], [address] [place], [place] [address]     Labs: Results for orders placed or performed during the hospital encounter of 11/10/21 (from the past 48 hour(s))  Lipase, blood     Status: None   Collection Time: 11/10/21  2:54 PM  Result Value Ref Range   Lipase 38 11 - 51 U/L    Comment: Performed at [facility], [address] [place], [place] [address]  Comprehensive metabolic panel     Status: Abnormal   Collection Time: 11/10/21  2:54 PM  Result Value Ref Range   Sodium 138 135 - 145 mmol/L   Potassium 4.0 3.5 - 5.1 mmol/L   Chloride 103 98 - 111 mmol/L   CO2 23 22 - 32 mmol/L    Glucose, Bld 128 (H) 70 - 99 mg/dL    Comment: Glucose reference range applies only to samples taken after fasting for at least 8 hours.   BUN 14 6 - 20 mg/dL   Creatinine, Ser 4.16 0.44 - 1.00 mg/dL   Calcium 9.6 8.9 - 60.6 mg/dL   Total Protein 8.2 (H) 6.5 - 8.1 g/dL   Albumin 4.2 3.5 - 5.0 g/dL   AST 24 15 - 41 U/L   ALT 20 0 - 44 U/L   Alkaline Phosphatase 71 38 - 126 U/L   Total Bilirubin 0.4 0.3 - 1.2 mg/dL   GFR, Estimated >30 >16 mL/min    Comment: (NOTE) Calculated using the CKD-EPI Creatinine Equation (2021)    Anion gap 12 5 - 15    Comment: Performed at [facility], [address] [place], [place] [address]  CBC     Status: Abnormal   Collection Time: 11/10/21  2:54 PM  Result Value Ref Range   WBC 9.9 4.0 - 10.5 K/uL   RBC 4.77 3.87 - 5.11 MIL/uL   Hemoglobin 13.5 12.0 - 15.0 g/dL   HCT 23.5 57.3 - 22.0 %   MCV 86.2 80.0 - 100.0 fL   MCH 28.3 26.0 - 34.0 pg   MCHC 32.8 30.0 - 36.0 g/dL   RDW 25.4 27.0 - 62.3 %   Platelets 422 (H) 150 - 400 K/uL   nRBC 0.0 0.0 - 0.2 %    Comment: Performed at [facility], [address] [place], [place] [address]  Resp Panel by RT-PCR (Flu A&B, Covid) Nasopharyngeal Swab     Status: None   Collection Time: 11/10/21  4:58 PM   Specimen: Nasopharyngeal Swab; Nasopharyngeal(NP) swabs in vial transport medium  Result Value Ref Range   SARS Coronavirus 2 by RT PCR NEGATIVE NEGATIVE    Comment: (NOTE) SARS-CoV-2 target nucleic acids are NOT DETECTED.  The SARS-CoV-2 RNA is generally detectable in upper respiratory specimens during the acute phase of infection. The lowest concentration of SARS-CoV-2 viral copies this assay can detect is 138 copies/mL. A negative result does not preclude SARS-Cov-2 infection and should not be used as the sole basis for treatment or other patient management decisions. A negative result may occur with  improper specimen collection/handling, submission of specimen other than  nasopharyngeal swab, presence of viral mutation(s) within the areas targeted by this assay, and inadequate number of viral copies(<138 copies/mL). A negative result must be combined with clinical observations, patient history, and epidemiological information. The expected result is Negative.  Fact Sheet for Patients:  BloggerCourse.com  Fact Sheet for Healthcare Providers:  SeriousBroker.it  This  test is no t yet approved or cleared by the [place] and  has been authorized for detection and/or diagnosis of SARS-CoV-2 by FDA under an Emergency Use Authorization (EUA). This EUA will remain  in effect (meaning this test can be used) for the duration of the COVID-19 declaration under Section 564(b)(1) of the Act, 21 U.S.C.section 360bbb-3(b)(1), unless the authorization is terminated  or revoked sooner.       Influenza A by PCR NEGATIVE NEGATIVE   Influenza B by PCR NEGATIVE NEGATIVE    Comment: (NOTE) The Xpert Xpress SARS-CoV-2/FLU/RSV plus assay is intended as an aid in the diagnosis of influenza from Nasopharyngeal swab specimens and should not be used as a sole basis for treatment. Nasal washings and aspirates are unacceptable for Xpert Xpress SARS-CoV-2/FLU/RSV testing.  Fact Sheet for Patients: BloggerCourse.com  Fact Sheet for Healthcare Providers: SeriousBroker.it  This test is not yet approved or cleared by the [place] FDA and has been authorized for detection and/or diagnosis of SARS-CoV-2 by FDA under an Emergency Use Authorization (EUA). This EUA will remain in effect (meaning this test can be used) for the duration of the COVID-19 declaration under Section 564(b)(1) of the Act, 21 U.S.C. section 360bbb-3(b)(1), unless the authorization is terminated or revoked.  Performed at [facility], [address] [place], [place] [address]    Urinalysis, Routine w reflex microscopic     Status: Abnormal   Collection Time: 11/10/21  5:32 PM  Result Value Ref Range   Color, Urine YELLOW YELLOW   APPearance CLEAR CLEAR   Specific Gravity, Urine 1.010 1.005 - 1.030   pH 7.0 5.0 - 8.0   Glucose, UA NEGATIVE NEGATIVE mg/dL   Hgb urine dipstick SMALL (A) NEGATIVE   Bilirubin Urine NEGATIVE NEGATIVE   Ketones, ur NEGATIVE NEGATIVE mg/dL   Protein, ur 30 (A) NEGATIVE mg/dL   Nitrite NEGATIVE NEGATIVE   Leukocytes,Ua NEGATIVE NEGATIVE    Comment: Performed at [facility], [address] [place], [place] [address]  Urinalysis, Microscopic (reflex)     Status: Abnormal   Collection Time: 11/10/21  5:32 PM  Result Value Ref Range   RBC / HPF 0-5 0 - 5 RBC/hpf   WBC, UA 0-5 0 - 5 WBC/hpf   Bacteria, UA MANY (A) NONE SEEN   Squamous Epithelial / LPF 0-5 0 - 5    Comment: Performed at [facility], [address] [place], [place] [address]  CBC     Status: None   Collection Time: 11/11/21  4:17 AM  Result Value Ref Range   WBC 9.6 4.0 - 10.5 K/uL   RBC 4.45 3.87 - 5.11 MIL/uL   Hemoglobin 12.7 12.0 - 15.0 g/dL   HCT 01.0 27.2 - 53.6 %   MCV 88.3 80.0 - 100.0 fL   MCH 28.5 26.0 - 34.0 pg   MCHC 32.3 30.0 - 36.0 g/dL   RDW 64.4 03.4 - 74.2 %   Platelets 362 150 - 400 K/uL   nRBC 0.0 0.0 - 0.2 %    Comment: Performed at [facility], [address], [place], [place] [address]  Basic metabolic panel     Status: Abnormal   Collection Time: 11/11/21  4:17 AM  Result Value Ref Range   Sodium 138 135 - 145 mmol/L   Potassium 3.8 3.5 - 5.1 mmol/L   Chloride 104 98 - 111 mmol/L   CO2 24 22 -  32 mmol/L   Glucose, Bld 154 (H) 70 - 99 mg/dL    Comment: Glucose reference range applies only to samples taken after fasting for at least 8 hours.   BUN 14 6 - 20 mg/dL   Creatinine, Ser 2.75 0.44 - 1.00 mg/dL   Calcium 9.3 8.9 - 17.0 mg/dL   GFR, Estimated >01 >74 mL/min    Comment:  (NOTE) Calculated using the CKD-EPI Creatinine Equation (2021)    Anion gap 10 5 - 15    Comment: Performed at [facility] [address] [place], [place] [address]    Imaging / Studies: DG Abdomen 1 View  Result Date: 11/10/2021 CLINICAL DATA:  NG tube placement. EXAM: ABDOMEN - 1 VIEW COMPARISON:  None. FINDINGS: NG tube with distal tip below the GE junction. Side port is at the level of the GE junction. Lung bases are clear. Paucity of bowel gas. IMPRESSION: 1. NG tube with distal tip below the GE junction but side port is at the level of GE junction, it could be advanced 4-5 cm. 2.  Lung bases are clear.  Paucity of bowel gas. Electronically Signed   By: Larose Hires D.O.   On: 11/10/2021 18:02   CT ABDOMEN PELVIS W CONTRAST  Result Date: 11/10/2021 CLINICAL DATA:  Bowel obstruction suspected EXAM: CT ABDOMEN AND PELVIS WITH CONTRAST TECHNIQUE: Multidetector CT imaging of the abdomen and pelvis was performed using the standard protocol following bolus administration of intravenous contrast. RADIATION DOSE REDUCTION: This exam was performed according to the departmental dose-optimization program which includes automated exposure control, adjustment of the mA and/or kV according to patient size and/or use of iterative reconstruction technique. CONTRAST:  OMNIPAQUE IOHEXOL 300 MG/ML  SOLN COMPARISON:  March 18, 2021 FINDINGS: Lower chest: No acute abnormality. Hepatobiliary: Multiple cholelithiasis in a distended gallbladder without ancillary evidence of acute cholecystitis. Liver is unremarkable. Portal vein is patent. No intrahepatic or extrahepatic biliary ductal dilation. Pancreas: Unremarkable. No pancreatic ductal dilatation or surrounding inflammatory changes. Spleen: Normal in size without focal abnormality. Adrenals/Urinary Tract: Adrenal glands are unremarkable. Kidneys enhance symmetrically. No evidence of hydronephrosis. There are innumerable bilateral  nonobstructing nephrolithiasis. Resolution of previously described RIGHT-sided hydronephrosis. Bladder is decompressed. Stomach/Bowel: Status post diverting ostomy in the LEFT hemiabdomen. Ostomy contains several loops of herniated small bowel as well as a small amount of fluid. Multiple of these loops of small bowel are dilated. Representative dilated loop of small bowel measures 3.4 cm. Transition point within the ostomy may be at a site of additional mesenteric defect (series 5, image 87; series 2, image 44;). There are several decompressed loops of bowel within the superior aspect of the ostomy. Colon within the ostomy is decompressed. There is a loop of mildly dilated small bowel within the LEFT lower quadrant which measures 3.0 cm. Small hiatal hernia. Vascular/Lymphatic: No significant vascular findings are present. No enlarged abdominal or pelvic lymph nodes. Reproductive: Uterus and bilateral adnexa are unremarkable. Other: No free air. Musculoskeletal: Degenerative changes of the lumbar spine. IMPRESSION: 1. There is small bowel obstruction due to several loops of herniated small bowel within the LEFT abdominal ostomy. There is a small amount of fluid within the ostomy without definite evidence of ischemia or perforation. Electronically Signed   By: Meda Klinefelter M.D.   On: 11/10/2021 16:37    Medications / Allergies: per chart  Antibiotics: Anti-infectives (From admission, onward)    None         Note: Portions of  this report may have been transcribed using voice recognition software. Every effort was made to ensure accuracy; however, inadvertent computerized transcription errors may be present.   Any transcriptional errors that result from this process are unintentional.    Ardeth SportsmanSteven C. Lilli Dewald, MD, FACS, MASCRS Esophageal, Gastrointestinal & Colorectal Surgery Robotic and Minimally Invasive Surgery  [facility]   [facility]  [address] [place], [place] [id number] [phone] Fax [phone] Main  CONTACT INFORMATION:  Weekday (9AM-5PM): Call [facility] main office at [phone]  Weeknight (5PM-9AM) or Weekend/Holiday: Check [email or web address] (password " [email or web address]") for General Surgery [facility] coverage  (Please, do not use SecureChat as it is not reliable communication to operating surgeons for immediate patient care)      11/11/2021  7:26 AM

## 2021-11-11 NOTE — Hospital Course (Addendum)
Lindsay Diaz is a 51 y.o. female with a history of ulcerative colitis s/p colostomy, hypertension, depression. Patient presented secondary to abdominal pain, nausea, vomiting and decreased ostomy output. CT scan significant for small bowel obstruction. General surgery consulted. NG tube placed. Bowel rest. Patient without improvement of SBO and requiring surgical management. ?3/7 S/P laparoscopic primary repair of parastomal hernia and lysis of adhesion. ?3/10 NG removed and diet advanced ?

## 2021-11-12 ENCOUNTER — Inpatient Hospital Stay (HOSPITAL_COMMUNITY): Payer: BC Managed Care – PPO

## 2021-11-12 DIAGNOSIS — R8271 Bacteriuria: Secondary | ICD-10-CM | POA: Diagnosis not present

## 2021-11-12 DIAGNOSIS — K56609 Unspecified intestinal obstruction, unspecified as to partial versus complete obstruction: Secondary | ICD-10-CM | POA: Diagnosis not present

## 2021-11-12 DIAGNOSIS — K433 Parastomal hernia with obstruction, without gangrene: Secondary | ICD-10-CM | POA: Diagnosis not present

## 2021-11-12 MED ORDER — MORPHINE SULFATE (PF) 2 MG/ML IV SOLN
1.0000 mg | Freq: Once | INTRAVENOUS | Status: AC
  Administered 2021-11-12: 1 mg via INTRAVENOUS
  Filled 2021-11-12: qty 1

## 2021-11-12 NOTE — Progress Notes (Signed)
?  Transition of Care (TOC) Screening Note ? ? ?Patient Details  ?Name: Lindsay Diaz ?Date of Birth: 1971/04/18 ? ? ?Transition of Care (TOC) CM/SW Contact:    ?Kamarrion Stfort, LCSW ?Phone Number: ?11/12/2021, 10:33 AM ? ? ? ?Transition of Care Department [facility]) has reviewed patient and no TOC needs have been identified at this time. We will continue to monitor patient advancement through interdisciplinary progression rounds. If new patient transition needs arise, please place a TOC consult. ? ? ?

## 2021-11-12 NOTE — Progress Notes (Signed)
PROGRESS NOTE    Lindsay Diaz  [id number] DOB: Aug 21, 1971 DOA: 11/10/2021 PCP: [facility]   Brief Narrative: Lindsay Diaz is a 51 y.o. female with a history of ulcerative colitis s/p colostomy, hypertension, depression. Patient presented secondary to abdominal pain, nausea, vomiting and decreased ostomy output. CT scan significant for small bowel obstruction. General surgery consulted. NG tube placed. Bowel rest.   Assessment and Plan: * SBO (small bowel obstruction) (HCC) CT abdomen significant for SBO with herniated small bowel. General surgery consulted. NG tube placed and patient made NPO. Bowel rest. -General surgery recommendations: ambulation, concern for possible need for surgical management -IV fluids  Morbid obesity with body mass index (BMI) of 40.0 to 49.9 (HCC) Body mass index is 43.85 kg/m.  Asymptomatic bacteriuria Noted.  HTN (hypertension) Patient is on atenolol as an outpatient which is held on admission secondary to SBO.  -IV hydralazine prn SBP >160  Thrombocytosis-resolved as of 11/11/2021 Transient. Resolved.    DVT prophylaxis: SCDs Code Status:   Code Status: Full Code Family Communication: Son and family friend at bedside Disposition Plan: Discharge home pending improvement of SBO/general surgery recommendations   Consultants:  General surgery  Procedures:  NG Tube  Antimicrobials: None    Subjective: Small amount of emesis last night. No ostomy output or air in the bag  Objective: BP (!) 168/86 (BP Location: Right Arm)    Pulse 94    Temp 98.1 F (36.7 C) (Oral)    Resp 18    Ht 5\' 7"  (1.702 m)    Wt 119 kg    SpO2 97%    BMI 41.09 kg/m   Examination:  General exam: Appears calm and comfortable Respiratory system: Clear to auscultation. Respiratory effort normal. Cardiovascular system: S1 & S2 heard, RRR. Gastrointestinal system: Abdomen is mildly distended, soft and nontender. Normal  bowel sounds heard. Central nervous system: Alert and oriented. No focal neurological deficits. Musculoskeletal: No calf tenderness Skin: No cyanosis. No rashes Psychiatry: Judgement and insight appear normal. Mood & affect appropriate.    Data Reviewed: I have personally reviewed following labs and imaging studies  CBC Lab Results  Component Value Date   WBC 9.6 11/11/2021   RBC 4.45 11/11/2021   HGB 12.7 11/11/2021   HCT 39.3 11/11/2021   MCV 88.3 11/11/2021   MCH 28.5 11/11/2021   PLT 362 11/11/2021   MCHC 32.3 11/11/2021   RDW 13.2 11/11/2021   LYMPHSABS 1.4 03/18/2021   MONOABS 0.6 03/18/2021   EOSABS 0.2 03/18/2021   BASOSABS 0.0 [id number]     Last metabolic panel Lab Results  Component Value Date   NA 138 11/11/2021   K 3.8 11/11/2021   CL 104 11/11/2021   CO2 24 11/11/2021   BUN 14 11/11/2021   CREATININE 0.75 11/11/2021   GLUCOSE 154 (H) 11/11/2021   GFRNONAA >60 11/11/2021   GFRAA 91 05/20/2019   CALCIUM 9.3 11/11/2021   PROT 8.2 (H) 11/10/2021   ALBUMIN 4.2 11/10/2021   LABGLOB 2.8 05/20/2019   AGRATIO 1.4 05/20/2019   BILITOT 0.4 11/10/2021   ALKPHOS 71 11/10/2021   AST 24 11/10/2021   ALT 20 11/10/2021   ANIONGAP 10 11/11/2021    GFR: Estimated Creatinine Clearance: 109.9 mL/min (by C-G formula based on SCr of 0.75 mg/dL).  Recent Results (from the past 240 hour(s))  Resp Panel by RT-PCR (Flu A&B, Covid) Nasopharyngeal Swab     Status: None   Collection Time: 11/10/21  4:58 PM  Specimen: Nasopharyngeal Swab; Nasopharyngeal(NP) swabs in vial transport medium  Result Value Ref Range Status   SARS Coronavirus 2 by RT PCR NEGATIVE NEGATIVE Final    Comment: (NOTE) SARS-CoV-2 target nucleic acids are NOT DETECTED.  The SARS-CoV-2 RNA is generally detectable in upper respiratory specimens during the acute phase of infection. The lowest concentration of SARS-CoV-2 viral copies this assay can detect is 138 copies/mL. A negative result does not  preclude SARS-Cov-2 infection and should not be used as the sole basis for treatment or other patient management decisions. A negative result may occur with  improper specimen collection/handling, submission of specimen other than nasopharyngeal swab, presence of viral mutation(s) within the areas targeted by this assay, and inadequate number of viral copies(<138 copies/mL). A negative result must be combined with clinical observations, patient history, and epidemiological information. The expected result is Negative.  Fact Sheet for Patients:  EntrepreneurPulse.com.au  Fact Sheet for Healthcare Providers:  IncredibleEmployment.be  This test is no t yet approved or cleared by the [place] FDA and  has been authorized for detection and/or diagnosis of SARS-CoV-2 by FDA under an Emergency Use Authorization (EUA). This EUA will remain  in effect (meaning this test can be used) for the duration of the COVID-19 declaration under Section 564(b)(1) of the Act, 21 U.S.C.section 360bbb-3(b)(1), unless the authorization is terminated  or revoked sooner.       Influenza A by PCR NEGATIVE NEGATIVE Final   Influenza B by PCR NEGATIVE NEGATIVE Final    Comment: (NOTE) The Xpert Xpress SARS-CoV-2/FLU/RSV plus assay is intended as an aid in the diagnosis of influenza from Nasopharyngeal swab specimens and should not be used as a sole basis for treatment. Nasal washings and aspirates are unacceptable for Xpert Xpress SARS-CoV-2/FLU/RSV testing.  Fact Sheet for Patients: EntrepreneurPulse.com.au  Fact Sheet for Healthcare Providers: IncredibleEmployment.be  This test is not yet approved or cleared by the [place] FDA and has been authorized for detection and/or diagnosis of SARS-CoV-2 by FDA under an Emergency Use Authorization (EUA). This EUA will remain in effect (meaning this test can be used) for the  duration of the COVID-19 declaration under Section 564(b)(1) of the Act, 21 U.S.C. section 360bbb-3(b)(1), unless the authorization is terminated or revoked.  Performed at [facility], [address], [place], [place] [address]       Radiology Studies: DG Abd 1 View  Result Date: 11/11/2021 CLINICAL DATA:  NG tube placement. EXAM: ABDOMEN - 1 VIEW COMPARISON:  11/11/2021. FINDINGS: There is mildly distended small bowel measuring up to 3.9 cm in diameter with retained contrast in the mid left abdomen. An enteric tube terminates in the stomach. A cluster of calcifications are present in the right upper quadrant suggesting cholelithiasis. IMPRESSION: 1. Enteric tube terminates in the stomach. 2. Mildly distended small bowel measuring up to 3.9 cm in diameter. 3. Cholelithiasis. Electronically Signed   By: Brett Fairy M.D.   On: 11/11/2021 20:23   DG Abdomen 1 View  Result Date: 11/10/2021 CLINICAL DATA:  NG tube placement. EXAM: ABDOMEN - 1 VIEW COMPARISON:  None. FINDINGS: NG tube with distal tip below the GE junction. Side port is at the level of the GE junction. Lung bases are clear. Paucity of bowel gas. IMPRESSION: 1. NG tube with distal tip below the GE junction but side port is at the level of GE junction, it could be advanced 4-5 cm. 2.  Lung bases are clear.  Paucity of bowel gas. Electronically  Signed   By: Keane Police D.O.   On: 11/10/2021 18:02   CT ABDOMEN PELVIS W CONTRAST  Result Date: 11/10/2021 CLINICAL DATA:  Bowel obstruction suspected EXAM: CT ABDOMEN AND PELVIS WITH CONTRAST TECHNIQUE: Multidetector CT imaging of the abdomen and pelvis was performed using the standard protocol following bolus administration of intravenous contrast. RADIATION DOSE REDUCTION: This exam was performed according to the departmental dose-optimization program which includes automated exposure control, adjustment of the mA and/or kV according to patient size and/or use of iterative  reconstruction technique. CONTRAST:  172mL OMNIPAQUE IOHEXOL 300 MG/ML  SOLN COMPARISON:  March 18, 2021 FINDINGS: Lower chest: No acute abnormality. Hepatobiliary: Multiple cholelithiasis in a distended gallbladder without ancillary evidence of acute cholecystitis. Liver is unremarkable. Portal vein is patent. No intrahepatic or extrahepatic biliary ductal dilation. Pancreas: Unremarkable. No pancreatic ductal dilatation or surrounding inflammatory changes. Spleen: Normal in size without focal abnormality. Adrenals/Urinary Tract: Adrenal glands are unremarkable. Kidneys enhance symmetrically. No evidence of hydronephrosis. There are innumerable bilateral nonobstructing nephrolithiasis. Resolution of previously described RIGHT-sided hydronephrosis. Bladder is decompressed. Stomach/Bowel: Status post diverting ostomy in the LEFT hemiabdomen. Ostomy contains several loops of herniated small bowel as well as a small amount of fluid. Multiple of these loops of small bowel are dilated. Representative dilated loop of small bowel measures 3.4 cm. Transition point within the ostomy may be at a site of additional mesenteric defect (series 5, image 87; series 2, image 44;). There are several decompressed loops of bowel within the superior aspect of the ostomy. Colon within the ostomy is decompressed. There is a loop of mildly dilated small bowel within the LEFT lower quadrant which measures 3.0 cm. Small hiatal hernia. Vascular/Lymphatic: No significant vascular findings are present. No enlarged abdominal or pelvic lymph nodes. Reproductive: Uterus and bilateral adnexa are unremarkable. Other: No free air. Musculoskeletal: Degenerative changes of the lumbar spine. IMPRESSION: 1. There is small bowel obstruction due to several loops of herniated small bowel within the LEFT abdominal ostomy. There is a small amount of fluid within the ostomy without definite evidence of ischemia or perforation. Electronically Signed   By:  Valentino Saxon M.D.   On: 11/10/2021 16:37   DG Abd 2 Views  Result Date: 11/12/2021 CLINICAL DATA:  History of ulcerative colitis post colostomy. Current abdominal pain with nausea and vomiting and decreased ostomy output. EXAM: ABDOMEN - 2 VIEW COMPARISON:  11/11/2021 and CT 11/10/2021 FINDINGS: Nasogastric tube in adequate position over the stomach in the left upper quadrant. Evidence of cholelithiasis. Air and stool present within the colon. 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 No free peritoneal air. Remainder the exam is unchanged. 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 Electronically Signed   By: Marin Olp M.D.   On: 11/12/2021 09:49   DG Abd Portable 1V-Small Bowel Obstruction Protocol-initial, 8 hr delay  Result Date: 11/11/2021 CLINICAL DATA:  Ulcer colitis evaluate for obstruction EXAM: PORTABLE ABDOMEN - 1 VIEW COMPARISON:  11/10/2021 FINDINGS: Multiple dilated loops of small bowel in the left side of the abdomen measuring to 4.4 cm likely within the parastomal hernia. The loops of bowel are filled with  contrast concerning for small bowel obstruction. No evidence of pneumoperitoneum, portal venous gas or pneumatosis. No pathologic calcifications along the expected course of the ureters. No acute osseous abnormality. IMPRESSION: 1. 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 The loops of bowel are filled with contrast concerning for small bowel obstruction. Electronically Signed   By: Kathreen Devoid M.D.   On: 11/11/2021 10:16       LOS: 2 days    Cordelia Poche, MD [facility] 11/12/2021, 1:05 PM   If 7PM-7AM, please contact night-coverage [email or web address]

## 2021-11-12 NOTE — Progress Notes (Signed)
? ? 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 NPO, NG decompression ? IV hydration ? AXR this AM ordered ? Encouraged OOB, ambulation ? May require operative intervention - discussed with patient; will discuss with colorectal surgery this AM ? ?      Darnell Level, MD ?      [facility] ?      Office: [phone] ? ? 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 ?Subjective: ?Patient in bed, family at bedside.  No flatus or BM.  Mild pain. ? ?Objective: ?Vital signs in last 24 hours: ?Temp:  [97.6 ?F (36.4 ?C)-98.2 ?F (36.8 ?C)] 98.1 ?F (36.7 ?C) (03/06 0532) ?Pulse Rate:  [85-94] 94 (03/06 0640) ?Resp:  [18] 18 (03/06 0532) ?BP: (147-179)/(62-88) 168/86 (03/06 0640) ?SpO2:  [95 %-99 %] 97 % (03/06 0640) ?Weight:  [696 kg] 119 kg (03/06 0640) ?Last BM Date : 11/09/21 ? ?Intake/Output from previous day: ?03/05 0701 - 03/06 0700 ?In: 645.5 [I.V.:645.5] ?Out: 400 [Urine:100; Emesis/NG output:300] ?Intake/Output this shift: ?No intake/output data recorded. ? ?Physical Exam: ?HEENT - sclerae clear, mucous membranes moist ?Neck - soft ?Abdomen - soft, mild distension; stoma viable, no output; parastomal hernia non-tender, not reducible ?Ext - no edema, non-tender ?Neuro - alert & oriented, no focal deficits ? ?Lab Results:  ?Recent Labs  ?  11/10/21 ?1454 11/11/21 ?7893  ?WBC 9.9 9.6  ?HGB 13.5 12.7  ?HCT 41.1 39.3  ?PLT 422* 362  ? ?BMET ?Recent Labs  ?  11/10/21 ?1454 11/11/21 ?8101  ?NA 138 138  ?K 4.0 3.8  ?CL 103 104  ?CO2 23 24  ?GLUCOSE 128* 154*  ?BUN 14 14  ?CREATININE 0.93 0.75  ?CALCIUM 9.6 9.3  ? ?PT/INR ?No results for input(s): LABPROT, INR in the last 72 hours. ?Comprehensive Metabolic Panel: ?   ?Component Value Date/Time  ? NA 138 11/11/2021 0417  ? NA 138 11/10/2021 1454  ? NA 138 05/20/2019 1527  ? K 3.8 11/11/2021 0417  ? K 4.0 11/10/2021 1454  ? CL 104 11/11/2021 0417  ? CL 103 11/10/2021 1454  ? CO2 24 11/11/2021 0417  ?  CO2 23 11/10/2021 1454  ? BUN 14 11/11/2021 0417  ? BUN 14 11/10/2021 1454  ? BUN 9 05/20/2019 1527  ? CREATININE 0.75 11/11/2021 0417  ? CREATININE 0.93 11/10/2021 1454  ? GLUCOSE 154 (H) 11/11/2021 0417  ? GLUCOSE 128 (H) 11/10/2021 1454  ? CALCIUM 9.3 11/11/2021 0417  ? CALCIUM 9.6 11/10/2021 1454  ? AST 24 11/10/2021 1454  ? AST 23 03/18/2021 1950  ? ALT 20 11/10/2021 1454  ? ALT 22 03/18/2021 1950  ? ALKPHOS 71 11/10/2021 1454  ? ALKPHOS 65 03/18/2021 1950  ? BILITOT 0.4 11/10/2021 1454  ? BILITOT 0.5 03/18/2021 1950  ? BILITOT <0.2 05/20/2019 1527  ? PROT 8.2 (H) 11/10/2021 1454  ? PROT 7.4 03/18/2021 1950  ? PROT 6.7 05/20/2019 1527  ? ALBUMIN 4.2 11/10/2021 1454  ? ALBUMIN 4.0 03/18/2021 1950  ? ALBUMIN 3.9 05/20/2019 1527  ? ? ?Studies/Results: ?DG Abd 1 View ? ?Result Date: 11/11/2021 ?CLINICAL DATA:  NG tube placement. EXAM: ABDOMEN - 1 VIEW COMPARISON:  11/11/2021. FINDINGS: There is mildly distended small bowel measuring up to 3.9 cm in diameter with retained contrast in the mid left abdomen. An enteric tube terminates in the stomach. A cluster of calcifications are present in the right upper quadrant suggesting cholelithiasis. IMPRESSION: 1. Enteric tube terminates in  the stomach. 2. Mildly distended small bowel measuring up to 3.9 cm in diameter. 3. Cholelithiasis. Electronically Signed   By: Thornell Sartorius M.D.   On: 11/11/2021 20:23  ? ?DG Abdomen 1 View ? ?Result Date: 11/10/2021 ?CLINICAL DATA:  NG tube placement. EXAM: ABDOMEN - 1 VIEW COMPARISON:  None. FINDINGS: NG tube with distal tip below the GE junction. Side port is at the level of the GE junction. Lung bases are clear. Paucity of bowel gas. IMPRESSION: 1. NG tube with distal tip below the GE junction but side port is at the level of GE junction, it could be advanced 4-5 cm. 2.  Lung bases are clear.  Paucity of bowel gas. Electronically Signed   By: Larose Hires D.O.   On: 11/10/2021 18:02  ? ?CT ABDOMEN PELVIS W CONTRAST ? ?Result Date:  11/10/2021 ?CLINICAL DATA:  Bowel obstruction suspected EXAM: CT ABDOMEN AND PELVIS WITH CONTRAST TECHNIQUE: Multidetector CT imaging of the abdomen and pelvis was performed using the standard protocol following bolus administration of intravenous contrast. RADIATION DOSE REDUCTION: This exam was performed according to the departmental dose-optimization program which includes automated exposure control, adjustment of the mA and/or kV according to patient size and/or use of iterative reconstruction technique. CONTRAST:  OMNIPAQUE IOHEXOL 300 MG/ML  SOLN COMPARISON:  March 18, 2021 FINDINGS: Lower chest: No acute abnormality. Hepatobiliary: Multiple cholelithiasis in a distended gallbladder without ancillary evidence of acute cholecystitis. Liver is unremarkable. Portal vein is patent. No intrahepatic or extrahepatic biliary ductal dilation. Pancreas: Unremarkable. No pancreatic ductal dilatation or surrounding inflammatory changes. Spleen: Normal in size without focal abnormality. Adrenals/Urinary Tract: Adrenal glands are unremarkable. Kidneys enhance symmetrically. No evidence of hydronephrosis. There are innumerable bilateral nonobstructing nephrolithiasis. Resolution of previously described RIGHT-sided hydronephrosis. Bladder is decompressed. Stomach/Bowel: Status post diverting ostomy in the LEFT hemiabdomen. Ostomy contains several loops of herniated small bowel as well as a small amount of fluid. Multiple of these loops of small bowel are dilated. Representative dilated loop of small bowel measures 3.4 cm. Transition point within the ostomy may be at a site of additional mesenteric defect (series 5, image 87; series 2, image 44;). There are several decompressed loops of bowel within the superior aspect of the ostomy. Colon within the ostomy is decompressed. There is a loop of mildly dilated small bowel within the LEFT lower quadrant which measures 3.0 cm. Small hiatal hernia. Vascular/Lymphatic: No  significant vascular findings are present. No enlarged abdominal or pelvic lymph nodes. Reproductive: Uterus and bilateral adnexa are unremarkable. Other: No free air. Musculoskeletal: Degenerative changes of the lumbar spine. IMPRESSION: 1. There is small bowel obstruction due to several loops of herniated small bowel within the LEFT abdominal ostomy. There is a small amount of fluid within the ostomy without definite evidence of ischemia or perforation. Electronically Signed   By: Meda Klinefelter M.D.   On: 11/10/2021 16:37  ? ?DG Abd Portable 1V-Small Bowel Obstruction Protocol-initial, 8 hr delay ? ?Result Date: 11/11/2021 ?CLINICAL DATA:  Ulcer colitis evaluate for obstruction EXAM: PORTABLE ABDOMEN - 1 VIEW COMPARISON:  11/10/2021 FINDINGS: Multiple dilated loops of small bowel in the left side of the abdomen measuring to 4.4 cm likely within the parastomal hernia. The loops of bowel are filled with contrast concerning for small bowel obstruction. No evidence of pneumoperitoneum, portal venous gas or pneumatosis. No pathologic calcifications along the expected course of the ureters. No acute osseous abnormality. IMPRESSION: 1. Multiple dilated loops of small bowel in the left side  of the abdomen measuring to 4.4 cm likely within the parastomal hernia. The loops of bowel are filled with contrast concerning for small bowel obstruction. Electronically Signed   By: Elige Ko M.D.   On: 11/11/2021 10:16   ? ? ? ?Darnell Level ?11/12/2021 ? ? Patient ID: Lindsay Diaz, female   DOB: February 24, 1971, 51 y.o.   MRN: [id number] ? ?

## 2021-11-13 ENCOUNTER — Inpatient Hospital Stay (HOSPITAL_COMMUNITY): Payer: BC Managed Care – PPO

## 2021-11-13 ENCOUNTER — Inpatient Hospital Stay (HOSPITAL_COMMUNITY): Payer: BC Managed Care – PPO | Admitting: Anesthesiology

## 2021-11-13 ENCOUNTER — Inpatient Hospital Stay (HOSPITAL_COMMUNITY): Payer: BC Managed Care – PPO | Admitting: Certified Registered"

## 2021-11-13 ENCOUNTER — Encounter (HOSPITAL_COMMUNITY)
Admission: EM | Disposition: A | Discharge status: Home / Self Care | Payer: Self-pay | Source: Home / Self Care | Attending: Internal Medicine

## 2021-11-13 DIAGNOSIS — R8271 Bacteriuria: Secondary | ICD-10-CM | POA: Diagnosis not present

## 2021-11-13 DIAGNOSIS — K56609 Unspecified intestinal obstruction, unspecified as to partial versus complete obstruction: Secondary | ICD-10-CM | POA: Diagnosis not present

## 2021-11-13 DIAGNOSIS — K433 Parastomal hernia with obstruction, without gangrene: Secondary | ICD-10-CM | POA: Diagnosis not present

## 2021-11-13 HISTORY — PX: LAPAROSCOPIC PARASTOMAL HERNIA: SHX6347

## 2021-11-13 LAB — BASIC METABOLIC PANEL
Anion gap: 8 (ref 5–15)
BUN: 18 mg/dL (ref 6–20)
CO2: 25 mmol/L (ref 22–32)
Calcium: 8.7 mg/dL — ABNORMAL LOW (ref 8.9–10.3)
Chloride: 105 mmol/L (ref 98–111)
Creatinine, Ser: 0.7 mg/dL (ref 0.44–1.00)
GFR, Estimated: >60 mL/min (ref >60)
Glucose, Bld: 110 mg/dL — ABNORMAL HIGH (ref 70–99)
Potassium: 3.6 mmol/L (ref 3.5–5.1)
Sodium: 138 mmol/L (ref 135–145)

## 2021-11-13 SURGERY — REPAIR, HERNIA, PARASTOMAL, LAPAROSCOPIC
Anesthesia: General | Site: Abdomen

## 2021-11-13 SURGERY — REPAIR, HERNIA, PARASTOMAL, LAPAROSCOPIC
Anesthesia: General

## 2021-11-13 MED ORDER — MIDAZOLAM HCL 2 MG/2ML IJ SOLN
INTRAMUSCULAR | Status: DC | PRN
  Administered 2021-11-13: 2 mg via INTRAVENOUS

## 2021-11-13 MED ORDER — BUPIVACAINE LIPOSOME 1.3 % IJ SUSP
INTRAMUSCULAR | Status: AC
  Filled 2021-11-13: qty 20

## 2021-11-13 MED ORDER — LACTATED RINGERS IR SOLN
Status: DC | PRN
Start: 2021-11-13 — End: 2021-11-13
  Administered 2021-11-13: 1000 mL

## 2021-11-13 MED ORDER — DEXAMETHASONE SODIUM PHOSPHATE 10 MG/ML IJ SOLN
INTRAMUSCULAR | Status: AC
  Filled 2021-11-13: qty 1

## 2021-11-13 MED ORDER — LABETALOL HCL 5 MG/ML IV SOLN
5.0000 mg | Freq: Once | INTRAVENOUS | Status: AC
  Administered 2021-11-13: 5 mg via INTRAVENOUS

## 2021-11-13 MED ORDER — BUPIVACAINE LIPOSOME 1.3 % IJ SUSP: 20.0000 mL | INTRAMUSCULAR | Status: DC

## 2021-11-13 MED ORDER — FENTANYL CITRATE (PF) 250 MCG/5ML IJ SOLN
INTRAMUSCULAR | Status: DC | PRN
  Administered 2021-11-13 (×4): 50 ug via INTRAVENOUS

## 2021-11-13 MED ORDER — CHLORHEXIDINE GLUCONATE 0.12 % MT SOLN
15.0000 mL | OROMUCOSAL | Status: AC
  Administered 2021-11-13: 15 mL via OROMUCOSAL

## 2021-11-13 MED ORDER — ROCURONIUM BROMIDE 10 MG/ML (PF) SYRINGE
PREFILLED_SYRINGE | INTRAVENOUS | Status: AC
  Filled 2021-11-13: qty 10

## 2021-11-13 MED ORDER — CEFAZOLIN SODIUM-DEXTROSE 2-4 GM/100ML-% IV SOLN
2.0000 g | INTRAVENOUS | Status: AC
  Administered 2021-11-13: 2 g via INTRAVENOUS
  Filled 2021-11-13: qty 100

## 2021-11-13 MED ORDER — PHENYLEPHRINE HCL-NACL 20-0.9 MG/250ML-% IV SOLN
INTRAVENOUS | Status: DC | PRN
Start: 2021-11-13 — End: 2021-11-13
  Administered 2021-11-13: 30 ug/min via INTRAVENOUS

## 2021-11-13 MED ORDER — BUPIVACAINE LIPOSOME 1.3 % IJ SUSP
INTRAMUSCULAR | Status: DC | PRN
  Administered 2021-11-13: 20 mL

## 2021-11-13 MED ORDER — STERILE WATER FOR IRRIGATION IR SOLN
Status: DC | PRN
  Administered 2021-11-13: 1000 mL

## 2021-11-13 MED ORDER — CEFAZOLIN SODIUM-DEXTROSE 2-4 GM/100ML-% IV SOLN: 2.0000 g | INTRAVENOUS | Status: DC

## 2021-11-13 MED ORDER — BUPIVACAINE-EPINEPHRINE 0.25% -1:200000 IJ SOLN
INTRAMUSCULAR | Status: DC | PRN
  Administered 2021-11-13: 60 mL

## 2021-11-13 MED ORDER — ONDANSETRON HCL 4 MG/2ML IJ SOLN
INTRAMUSCULAR | Status: AC
  Filled 2021-11-13: qty 2

## 2021-11-13 MED ORDER — LABETALOL HCL 5 MG/ML IV SOLN: 5.0000 mg | Freq: Once | INTRAVENOUS | Status: AC

## 2021-11-13 MED ORDER — DEXAMETHASONE SODIUM PHOSPHATE 10 MG/ML IJ SOLN
INTRAMUSCULAR | Status: DC | PRN
Start: 2021-11-13 — End: 2021-11-13
  Administered 2021-11-13: 4 mg via INTRAVENOUS

## 2021-11-13 MED ORDER — FENTANYL CITRATE PF 50 MCG/ML IJ SOSY
25.0000 ug | PREFILLED_SYRINGE | INTRAMUSCULAR | Status: DC | PRN
  Administered 2021-11-13: 25 ug via INTRAVENOUS

## 2021-11-13 MED ORDER — LIDOCAINE 2% (20 MG/ML) 5 ML SYRINGE
INTRAMUSCULAR | Status: DC | PRN
  Administered 2021-11-13: 100 mg via INTRAVENOUS

## 2021-11-13 MED ORDER — CHLORHEXIDINE GLUCONATE CLOTH 2 % EX PADS
6.0000 | MEDICATED_PAD | Freq: Once | CUTANEOUS | Status: AC
  Administered 2021-11-13: 6 via TOPICAL

## 2021-11-13 MED ORDER — MORPHINE SULFATE (PF) 2 MG/ML IV SOLN
1.0000 mg | INTRAVENOUS | Status: DC | PRN
  Administered 2021-11-13 – 2021-11-14 (×2): 2 mg via INTRAVENOUS
  Administered 2021-11-14 (×2): 1 mg via INTRAVENOUS
  Administered 2021-11-14 – 2021-11-16 (×4): 2 mg via INTRAVENOUS
  Filled 2021-11-13 (×8): qty 1

## 2021-11-13 MED ORDER — CHLORHEXIDINE GLUCONATE CLOTH 2 % EX PADS
6.0000 | MEDICATED_PAD | Freq: Every day | CUTANEOUS | Status: DC
  Administered 2021-11-14: 6 via TOPICAL

## 2021-11-13 MED ORDER — SUCCINYLCHOLINE CHLORIDE 200 MG/10ML IV SOSY
PREFILLED_SYRINGE | INTRAVENOUS | Status: DC | PRN
  Administered 2021-11-13: 200 mg via INTRAVENOUS

## 2021-11-13 MED ORDER — FENTANYL CITRATE (PF) 100 MCG/2ML IJ SOLN
INTRAMUSCULAR | Status: AC
Start: 2021-11-13 — End: ?
  Filled 2021-11-13: qty 2

## 2021-11-13 MED ORDER — LACTATED RINGERS IV SOLN: INTRAVENOUS | Status: DC | PRN

## 2021-11-13 MED ORDER — ROCURONIUM BROMIDE 10 MG/ML (PF) SYRINGE
PREFILLED_SYRINGE | INTRAVENOUS | Status: DC | PRN
  Administered 2021-11-13: 10 mg via INTRAVENOUS
  Administered 2021-11-13: 40 mg via INTRAVENOUS
  Administered 2021-11-13 (×2): 10 mg via INTRAVENOUS

## 2021-11-13 MED ORDER — LACTATED RINGERS IV SOLN
INTRAVENOUS | Status: DC
Start: 2021-11-13 — End: 2021-11-13

## 2021-11-13 MED ORDER — BUPIVACAINE-EPINEPHRINE (PF) 0.25% -1:200000 IJ SOLN
INTRAMUSCULAR | Status: AC
  Filled 2021-11-13: qty 60

## 2021-11-13 MED ORDER — SUCCINYLCHOLINE CHLORIDE 200 MG/10ML IV SOSY
PREFILLED_SYRINGE | INTRAVENOUS | Status: AC
  Filled 2021-11-13: qty 10

## 2021-11-13 MED ORDER — HYDROCORTISONE ACETATE 25 MG RE SUPP
25.0000 mg | Freq: Two times a day (BID) | RECTAL | Status: DC
  Administered 2021-11-15: 05:00:00 25 mg via RECTAL
  Filled 2021-11-13 (×6): qty 1

## 2021-11-13 MED ORDER — SUGAMMADEX SODIUM 200 MG/2ML IV SOLN
INTRAVENOUS | Status: DC | PRN
  Administered 2021-11-13: 200 mg via INTRAVENOUS

## 2021-11-13 MED ORDER — PHENYLEPHRINE 40 MCG/ML (10ML) SYRINGE FOR IV PUSH (FOR BLOOD PRESSURE SUPPORT)
PREFILLED_SYRINGE | INTRAVENOUS | Status: DC | PRN
  Administered 2021-11-13: 160 ug via INTRAVENOUS
  Administered 2021-11-13: 200 ug via INTRAVENOUS

## 2021-11-13 MED ORDER — FENTANYL CITRATE PF 50 MCG/ML IJ SOSY
PREFILLED_SYRINGE | INTRAMUSCULAR | Status: AC
  Administered 2021-11-13: 25 ug via INTRAVENOUS
  Filled 2021-11-13: qty 2

## 2021-11-13 MED ORDER — LIDOCAINE HCL (PF) 2 % IJ SOLN
INTRAMUSCULAR | Status: AC
  Filled 2021-11-13: qty 5

## 2021-11-13 MED ORDER — MIDAZOLAM HCL 2 MG/2ML IJ SOLN
INTRAMUSCULAR | Status: AC
  Filled 2021-11-13: qty 2

## 2021-11-13 MED ORDER — PROPOFOL 10 MG/ML IV BOLUS
INTRAVENOUS | Status: DC | PRN
  Administered 2021-11-13: 150 mg via INTRAVENOUS

## 2021-11-13 MED ORDER — PROPOFOL 10 MG/ML IV BOLUS
INTRAVENOUS | Status: AC
  Filled 2021-11-13: qty 20

## 2021-11-13 MED ORDER — LABETALOL HCL 5 MG/ML IV SOLN
INTRAVENOUS | Status: AC
  Administered 2021-11-13: 5 mg via INTRAVENOUS
  Filled 2021-11-13: qty 4

## 2021-11-13 MED ORDER — METRONIDAZOLE 500 MG/100ML IV SOLN
500.0000 mg | INTRAVENOUS | Status: AC
  Administered 2021-11-13: 500 mg via INTRAVENOUS
  Filled 2021-11-13: qty 100

## 2021-11-13 MED ORDER — 0.9 % SODIUM CHLORIDE (POUR BTL) OPTIME
TOPICAL | Status: DC | PRN
  Administered 2021-11-13: 1000 mL

## 2021-11-13 MED ORDER — FENTANYL CITRATE (PF) 100 MCG/2ML IJ SOLN
INTRAMUSCULAR | Status: AC
  Filled 2021-11-13: qty 2

## 2021-11-13 SURGICAL SUPPLY — 28 items
CHLORAPREP W/TINT 26 (MISCELLANEOUS) ×1 IMPLANT
DEVICE TROCAR PUNCTURE CLOSURE (ENDOMECHANICALS) ×1 IMPLANT
DRAIN CHANNEL 19F RND (DRAIN) ×1 IMPLANT
DRAPE UTILITY XL STRL (DRAPES) ×1 IMPLANT
DRAPE WARM FLUID 44X44 (DRAPES) ×1 IMPLANT
ELECT REM PT RETURN 15FT ADLT (MISCELLANEOUS) ×1 IMPLANT
EVACUATOR SILICONE 100CC (DRAIN) ×1 IMPLANT
GOWN STRL REUS W/ TWL LRG LVL3 (GOWN DISPOSABLE) IMPLANT
GOWN STRL REUS W/TWL LRG LVL3 (GOWN DISPOSABLE) ×2 IMPLANT
GOWN STRL REUS W/TWL XL LVL3 (GOWN DISPOSABLE) ×3 IMPLANT
IRRIG SUCT STRYKERFLOW 2 WTIP (MISCELLANEOUS) ×2
IRRIGATION SUCT STRKRFLW 2 WTP (MISCELLANEOUS) IMPLANT
KIT BASIN OR (CUSTOM PROCEDURE TRAY) ×1 IMPLANT
KIT TURNOVER KIT A (KITS) ×1 IMPLANT
NDL SPNL 22GX3.5 QUINCKE BK (NEEDLE) IMPLANT
NEEDLE SPNL 22GX3.5 QUINCKE BK (NEEDLE) ×2 IMPLANT
PAD POSITIONING PINK XL (MISCELLANEOUS) ×1 IMPLANT
PROTECTOR NERVE ULNAR (MISCELLANEOUS) ×1 IMPLANT
SET TUBE SMOKE EVAC HIGH FLOW (TUBING) ×1 IMPLANT
SHEARS HARMONIC ACE PLUS 45CM (MISCELLANEOUS) ×1 IMPLANT
SLEEVE ENDOPATH XCEL 5M (ENDOMECHANICALS) ×2 IMPLANT
SPONGE T-LAP 18X18 ~~LOC~~+RFID (SPONGE) ×1 IMPLANT
SUT MNCRL AB 4-0 PS2 18 (SUTURE) ×1 IMPLANT
SUT PDS AB 1 CT1 27 (SUTURE) ×3 IMPLANT
SUT PROLENE 2 0 SH DA (SUTURE) ×1 IMPLANT
TOWEL OR 17X26 10 PK STRL BLUE (TOWEL DISPOSABLE) ×1 IMPLANT
TRAY FOLEY MTR SLVR 14FR STAT (SET/KITS/TRAYS/PACK) ×1 IMPLANT
TRAY LAPAROSCOPIC (CUSTOM PROCEDURE TRAY) ×1 IMPLANT

## 2021-11-13 NOTE — Assessment & Plan Note (Addendum)
Patient not currently on therapy. Previously on mesalamine. Has a history of prednisone use in the past. Recent colonoscopy significant for ulcers noted in the rectum ?No further bleeding. ?

## 2021-11-13 NOTE — Interval H&P Note (Signed)
History and Physical Interval Note:  11/13/2021 1:31 PM  Lindsay Diaz  has presented today for surgery, with the diagnosis of incarcerated hernia.  The various methods of treatment have been discussed with the patient and family. After consideration of risks, benefits and other options for treatment, the patient has consented to  Procedure(s): LAPAROSCOPIC PARASTOMAL HERNIA possible open (N/A) as a surgical intervention.  The patient's history has been reviewed, patient examined, no change in status, stable for surgery.  I have reviewed the patient's chart and labs.  Questions were answered to the patient's satisfaction.    I have re-reviewed the the patient's records, history, medications, and allergies.  I have re-examined the patient.  I again discussed intraoperative plans and goals of post-operative recovery.  The patient agrees to proceed.  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  11/11/2021   Morbid obesity with body mass index (BMI) of 40.0 to 49.9 (HCC) 11/11/2021   SBO (small bowel obstruction) (HCC) 11/10/2021   HTN (hypertension) 11/10/2021   Asymptomatic bacteriuria 11/10/2021    Past Medical History:  Diagnosis Date   Depression    Hypertension    Ulcerative colitis (HCC)     Past Surgical History:  Procedure Laterality Date   CATARACT EXTRACTION     right eye   CESAREAN SECTION     x3   COLECTOMY WITH COLOSTOMY CREATION/HARTMANN PROCEDURE  2018   In [place]    Social History   Socioeconomic History   Marital status: Divorced    Spouse  name: Not on file   Number of children: Not on file   Years of education: Not on file   Highest education level: Not on file  Occupational History   Not on file  Tobacco Use   Smoking status: Never   Smokeless tobacco: Never  Substance and Sexual Activity   Alcohol use: Not Currently    Alcohol/week: 1.0 standard drink    Types: 1 Glasses of wine per week   Drug use: Not Currently   Sexual activity: Not on file  Other Topics Concern   Not on file  Social History Narrative   Not on file   Social Determinants of Health   Financial Resource Strain: Not on file  Food Insecurity: Not on file  Transportation Needs: Not on file  Physical Activity: Not on file  Stress: Not on file  Social Connections: Not on file  Intimate Partner Violence: Not on file    History reviewed. No pertinent family history.  Facility-Administered Medications Prior to Admission  Medication Dose Route Frequency Provider Last Rate Last Admin   acyclovir (ZOVIRAX) tablet 800 mg  800 mg Oral TID Micki Riley, MD       Medications Prior to Admission  Medication Sig Dispense Refill Last Dose   Acetaminophen (TYLENOL 8 HOUR PO) Take 650 mg by mouth daily.   11/10/2021   atenolol (TENORMIN) 50 MG tablet Take 50 mg by mouth at bedtime.   11/09/2021 at 2230   buPROPion (WELLBUTRIN SR) 150 MG 12 hr tablet Take 150 mg by mouth daily.  11/10/2021   Cholecalciferol (VITAMIN D-3) 125 MCG (5000 UT) TABS Take 5,000 Units by mouth at bedtime.   11/09/2021   citalopram (CELEXA) 40 MG tablet Take 40 mg by mouth daily.   11/10/2021   Turmeric 500 MG CAPS Take 500 mg by mouth at bedtime.   11/09/2021   zolpidem (AMBIEN) 10 MG tablet Take 10 mg by mouth at bedtime.   11/09/2021   mesalamine (LIALDA) 1.2 g EC tablet Take 1.2 g by mouth 2 (two) times daily. (Patient not taking: Reported on 11/10/2021)   Not Taking   oxyCODONE (ROXICODONE) 5 MG immediate release tablet Take 1 tablet (5 mg total) by mouth every 4 (four) hours as needed for  severe pain. (Patient not taking: Reported on 11/10/2021) 8 tablet 0 Not Taking    Current Facility-Administered Medications  Medication Dose Route Frequency Provider Last Rate Last Admin   0.45 % sodium chloride infusion   Intravenous Continuous Narda Bonds, MD 75 mL/hr at 11/13/21 0532 New Bag at 11/13/21 0532   [MAR Hold] alum & mag hydroxide-simeth (MAALOX/MYLANTA) 200-200-20 MG/5ML suspension 30 mL  30 mL Oral Q6H PRN Karie Soda, MD       [START ON 11/14/2021] bupivacaine liposome (EXPAREL) 1.3 % injection 266 mg  20 mL Infiltration On Call to OR Karie Soda, MD       [START ON 11/14/2021] ceFAZolin (ANCEF) IVPB 2g/100 mL premix  2 g Intravenous On Call to OR Karie Soda, MD       And   Melene Muller ON 11/14/2021] metroNIDAZOLE (FLAGYL) IVPB 500 mg  500 mg Intravenous On Call to OR Karie Soda, MD       [facility]itzi Hansen Hold] ceFAZolin (ANCEF) IVPB 2g/100 mL premix  2 g Intravenous On Call to OR Barnetta Chapel, PA-C       Chlorhexidine Gluconate Cloth 2 % PADS 6 each  6 each Topical Once Karie Soda, MD       [facility] Hold] diphenhydrAMINE (BENADRYL) injection 12.5-25 mg  12.5-25 mg Intravenous Q6H PRN Karie Soda, MD   25 mg at 11/12/21 2145   [facility] Hold] hydrALAZINE (APRESOLINE) injection 5 mg  5 mg Intravenous Q6H PRN John Giovanni, MD   5 mg at 11/12/21 0556   [MAR Hold] hydrocortisone (ANUSOL-HC) suppository 25 mg  25 mg Rectal BID Narda Bonds, MD       lactated ringers infusion   Intravenous Continuous Eilene Ghazi, MD 75 mL/hr at 11/13/21 1308 New Bag at 11/13/21 1308   [MAR Hold] lip balm (CARMEX) ointment 1 application  1 application. Topical BID Karie Soda, MD   1 application. at 11/13/21 1123   [MAR Hold] magic mouthwash  15 mL Oral QID PRN Karie Soda, MD       [MAR Hold] menthol-cetylpyridinium (CEPACOL) lozenge 3 mg  1 lozenge Oral PRN Karie Soda, MD       [facility] Hold] methocarbamol (ROBAXIN) 1,000 mg in dextrose 5 % 100 mL IVPB  1,000 mg Intravenous Q6H PRN Karie Soda, MD        [facility]itz[facility] Hansen Hold] metoCLOPramide (REGLAN) injection 10 mg  10 mg Intravenous Trixie Deis, MD   10 mg at 11/13/21 0529   [MAR Hold] phenol (CHLORASEPTIC) mouth spray 2 spray  2 spray Mouth/Throat PRN Karie Soda, MD       [facility]itz[facility] Hansen Hold] prochlorperazine (COMPAZINE) injection 5-10 mg  5-10 mg Intravenous Q4H PRN Karie Soda, MD   10 mg at 11/13/21 0234   [MAR Hold] simethicone (MYLICON) 40 MG/0.6ML suspension  80 mg  80 mg Oral QID PRN Karie Soda, MD         Allergies  Allergen Reactions   Zofran [Ondansetron] Nausea And Vomiting    BP (!) 151/88 (BP Location: Left Arm)    Pulse 91    Temp 98.2 F (36.8 C) (Oral)    Resp 16    Ht 5\' 7"  (1.702 m)    Wt 119 kg    SpO2 98%    BMI 41.09 kg/m   Labs: Results for orders placed or performed during the hospital encounter of 11/10/21 (from the past 48 hour(s))  Basic metabolic panel     Status: Abnormal   Collection Time: 11/13/21  4:28 AM  Result Value Ref Range   Sodium 138 135 - 145 mmol/L   Potassium 3.6 3.5 - 5.1 mmol/L   Chloride 105 98 - 111 mmol/L   CO2 25 22 - 32 mmol/L   Glucose, Bld 110 (H) 70 - 99 mg/dL    Comment: Glucose reference range applies only to samples taken after fasting for at least 8 hours.   BUN 18 6 - 20 mg/dL   Creatinine, Ser 7.97 0.44 - 1.00 mg/dL   Calcium 8.7 (L) 8.9 - 10.3 mg/dL   GFR, Estimated >28 >20 mL/min    Comment: (NOTE) Calculated using the CKD-EPI Creatinine Equation (2021)    Anion gap 8 5 - 15    Comment: Performed at [facility], [address], [place], [place] [address]    Imaging / Studies: DG Abd 1 View  Result Date: 11/11/2021 CLINICAL DATA:  NG tube placement. EXAM: ABDOMEN - 1 VIEW COMPARISON:  11/11/2021. FINDINGS: There is mildly distended small bowel measuring up to 3.9 cm in diameter with retained contrast in the mid left abdomen. An enteric tube terminates in the stomach. A cluster of calcifications are present in the right upper quadrant suggesting  cholelithiasis. IMPRESSION: 1. Enteric tube terminates in the stomach. 2. Mildly distended small bowel measuring up to 3.9 cm in diameter. 3. Cholelithiasis. Electronically Signed   By: Thornell Sartorius M.D.   On: 11/11/2021 20:23   DG Abdomen 1 View  Result Date: 11/10/2021 CLINICAL DATA:  NG tube placement. EXAM: ABDOMEN - 1 VIEW COMPARISON:  None. FINDINGS: NG tube with distal tip below the GE junction. Side port is at the level of the GE junction. Lung bases are clear. Paucity of bowel gas. IMPRESSION: 1. NG tube with distal tip below the GE junction but side port is at the level of GE junction, it could be advanced 4-5 cm. 2.  Lung bases are clear.  Paucity of bowel gas. Electronically Signed   By: Larose Hires D.O.   On: 11/10/2021 18:02   CT ABDOMEN PELVIS W CONTRAST  Result Date: 11/10/2021 CLINICAL DATA:  Bowel obstruction suspected EXAM: CT ABDOMEN AND PELVIS WITH CONTRAST TECHNIQUE: Multidetector CT imaging of the abdomen and pelvis was performed using the standard protocol following bolus administration of intravenous contrast. RADIATION DOSE REDUCTION: This exam was performed according to the departmental dose-optimization program which includes automated exposure control, adjustment of the mA and/or kV according to patient size and/or use of iterative reconstruction technique. CONTRAST:  OMNIPAQUE IOHEXOL 300 MG/ML  SOLN COMPARISON:  March 18, 2021 FINDINGS: Lower chest: No acute abnormality. Hepatobiliary: Multiple cholelithiasis in a distended gallbladder without ancillary evidence of acute cholecystitis. Liver is unremarkable. Portal vein is patent. No intrahepatic or extrahepatic biliary ductal dilation. Pancreas: Unremarkable. No pancreatic  ductal dilatation or surrounding inflammatory changes. Spleen: Normal in size without focal abnormality. Adrenals/Urinary Tract: Adrenal glands are unremarkable. Kidneys enhance symmetrically. No evidence of hydronephrosis. There are innumerable  bilateral nonobstructing nephrolithiasis. Resolution of previously described RIGHT-sided hydronephrosis. Bladder is decompressed. Stomach/Bowel: Status post diverting ostomy in the LEFT hemiabdomen. Ostomy contains several loops of herniated small bowel as well as a small amount of fluid. Multiple of these loops of small bowel are dilated. Representative dilated loop of small bowel measures 3.4 cm. Transition point within the ostomy may be at a site of additional mesenteric defect (series 5, image 87; series 2, image 44;). There are several decompressed loops of bowel within the superior aspect of the ostomy. Colon within the ostomy is decompressed. There is a loop of mildly dilated small bowel within the LEFT lower quadrant which measures 3.0 cm. Small hiatal hernia. Vascular/Lymphatic: No significant vascular findings are present. No enlarged abdominal or pelvic lymph nodes. Reproductive: Uterus and bilateral adnexa are unremarkable. Other: No free air. Musculoskeletal: Degenerative changes of the lumbar spine. IMPRESSION: 1. There is small bowel obstruction due to several loops of herniated small bowel within the LEFT abdominal ostomy. There is a small amount of fluid within the ostomy without definite evidence of ischemia or perforation. Electronically Signed   By: Meda KlinefelterStephanie  Peacock M.D.   On: 11/10/2021 16:37   DG Abd 2 Views  Result Date: 11/12/2021 CLINICAL DATA:  History of ulcerative colitis post colostomy. Current abdominal pain with nausea and vomiting and decreased ostomy output. EXAM: ABDOMEN - 2 VIEW COMPARISON:  11/11/2021 and CT 11/10/2021 FINDINGS: Nasogastric tube in adequate position over the stomach in the left upper quadrant. Evidence of cholelithiasis. Air and stool present within the colon. Persistent air and contrast filled dilated small bowel loops in the left abdomen measuring up to 4.9 cm compatible with recent CT findings of small bowel obstruction associated with patient's stomal  hernia in the left mid to lower abdomen. No free peritoneal air. Remainder the exam is unchanged. 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 Electronically Signed   By: Elberta Fortisaniel  Boyle M.D.   On: 11/12/2021 09:49   DG Abd Portable 1V-Small Bowel Obstruction Protocol-initial, 8 hr delay  Result Date: 11/11/2021 CLINICAL DATA:  Ulcer colitis evaluate for obstruction EXAM: PORTABLE ABDOMEN - 1 VIEW COMPARISON:  11/10/2021 FINDINGS: Multiple dilated loops of small bowel in the left side of the abdomen measuring to 4.4 cm likely within the parastomal hernia. The loops of bowel are filled with contrast concerning for small bowel obstruction. No evidence of pneumoperitoneum, portal venous gas or pneumatosis. No pathologic calcifications along the expected course of the ureters. No acute osseous abnormality. IMPRESSION: 1. 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 The loops of bowel are filled with contrast concerning for small bowel obstruction. Electronically Signed   By: Elige KoHetal  Patel M.D.   On: 11/11/2021 10:16     .Ardeth SportsmanSteven C. Damonta Cossey, M.D., F.A.C.S. Gastrointestinal and Minimally Invasive Surgery [facility] [address] [place], [place] [id number] [phone] Main / Paging  11/13/2021 1:31 PM    Ardeth SportsmanSteven C Marvel Mcphillips

## 2021-11-13 NOTE — Anesthesia Procedure Notes (Signed)
Procedure Name: Intubation ?Date/Time: 11/13/2021 2:12 PM ?Performed by: Eben Burow, CRNA ?Pre-anesthesia Checklist: Patient identified, Emergency Drugs available, Suction available, Patient being monitored and Timeout performed ?Patient Re-evaluated:Patient Re-evaluated prior to induction ?Oxygen Delivery Method: Circle system utilized ?Preoxygenation: Pre-oxygenation with 100% oxygen ?Induction Type: IV induction and Rapid sequence ?Laryngoscope Size: Mac and 4 ?Grade View: Grade I ?Tube type: Oral ?Tube size: 7.0 mm ?Number of attempts: 1 ?Airway Equipment and Method: Stylet ?Placement Confirmation: ETT inserted through vocal cords under direct vision, positive ETCO2 and breath sounds checked- equal and bilateral ?Secured at: 22 cm ?Tube secured with: Tape ?Dental Injury: Teeth and Oropharynx as per pre-operative assessment  ? ? ? ? ?

## 2021-11-13 NOTE — Op Note (Addendum)
11/13/2021  PATIENT:  Lindsay Diaz  51 y.o. female  Patient Care Team: [facility], [facility] as PCP - General (Family Medicine) Ramsay, Sharen Heck, MD as Referring Physician (Internal Medicine)  PRE-OPERATIVE DIAGNOSIS:   INCARCERATED PARASTOMAL HERNIA WITH SMALL BOWEL OBSTRUCTION  POST-OPERATIVE DIAGNOSIS:  INCARCERATED PARASTOMAL HERNIA WITH SMALL BOWEL OBSTRUCTION  PROCEDURE:   LAPAROSCOPIC PRIMARY REPAIR OF PARASTOMAL HERNIA LAPAROSCOPIC LYSIS OF ADHESIONS X 75 MIN (66% CASE) TAP BLOCK - LEFT  SURGEON:  Ardeth Sportsman, MD  ASSISTANT: Darnell Level, MD, FACS. An experienced assistant was required given the standard of surgical care given the complexity of the case.  This assistant was needed for exposure, dissection, suction, tissue approximation, retraction, perception, etc.  ANESTHESIA:     General  Regional TRANSVERSUS ABDOMINIS PLANE (TAP) nerve block for perioperative & postoperative pain control provided with liposomal bupivacaine (Experel) mixed with 0.25% bupivacaine as a Left TAP block x 57mL at the level of the transverse abdominis & preperitoneal spaces along the flank at the anterior axillary line, from subcostal ridge to iliac crest under laparoscopic guidance   EBL:  Total I/O In: -  Out: 250 [Urine:100; Emesis/NG output:150]  Per anesthesia record  Delay start of Pharmacological VTE agent (>24hrs) due to surgical blood loss or risk of bleeding:  no  DRAINS: none   SPECIMEN:  No Specimen  DISPOSITION OF SPECIMEN:  N/A  COUNTS:  YES  PLAN OF CARE: Admit to inpatient   PATIENT DISPOSITION:  PACU - hemodynamically stable.  INDICATION: Pleasant patient has developed a ventral wall abdominal hernia. Recommendation was made for surgical repair  The anatomy & physiology of the abdominal wall was discussed. The pathophysiology of hernias was discussed. Natural history risks without surgery including progeressive enlargement, pain,  incarceration & strangulation was discussed. Contributors to complications such as smoking, obesity, diabetes, prior surgery, etc were discussed.  I feel the risks of no intervention will lead to serious problems that outweigh the operative risks; therefore, I recommended surgery to reduce and repair the hernia. I explained laparoscopic techniques with possible need for an open approach. I noted the probable use of mesh to patch and/or buttress the hernia repair.  Risks such as bleeding, infection, abscess, need for further treatment, heart attack, death, and other risks were discussed. I noted a good likelihood this will help address the problem. Goals of post-operative recovery were discussed as well. Possibility that this will not correct all symptoms was explained. I stressed the importance of low-impact activity, aggressive pain control, avoiding constipation, & not pushing through pain to minimize risk of post-operative chronic pain or injury. Possibility of reherniation especially with smoking, obesity, diabetes, immunosuppression, and other health conditions was discussed. We will work to minimize complications.   An educational handout further explaining the pathology & treatment options was given as well. Questions were answered. The patient expresses understanding & wishes to proceed with surgery.   OR FINDINGS: Moderate size parastomal hernia around colostomy containing several feet of small bowel.  Obvious transition zone within it.  Resulting 6 x 5 cm parastomal hernia suture repaired with interrupted #1 PDS sutures x3 to have a more snug repair.  Small bowel initially ischemic but then perked up.  Transition zones within it like a closed-loop appearance but no perforation or ischemia or stricture.  Colon without any inflammation or colitis arguing against inflammatory bowel disease.   DESCRIPTION:   Informed consent was confirmed. The patient underwent general anaesthesia without  difficulty. The  patient was positioned appropriately. VTE prevention in place. The patient's abdomen was clipped, prepped, & draped in a sterile fashion. Surgical timeout confirmed our plan.  The patient was positioned in reverse Trendelenburg. Abdominal entry was gained using optical entry technique in the left upper abdomen. Entry was clean. I induced carbon dioxide insufflation. Camera inspection revealed no injury. Extra ports were carefully placed under direct laparoscopic visualization.   We could see  large swath of small bowel mesentery incarcerated within a left paramedian descending colostomy  on the parietal peritoneum under the abdominal wall.  I was able to reduce some small bowel out of the large hernia.  Dr. Gerrit Friends provided abdominal wall countertraction.  Found some bands up in the hernia sac that we freed off with harmonic scalpel and sharp scissors.  Gradually reduced several feet of small bowel.  Large volume.  Freed off adhesions to the descending colon.  Eventually got it all reduced.  It did look somewhat purplish and inflamed concerning for ischemia.  No frank necrosis nor perforation.  Aspirated some ascites.  No pus or purulence.  Found the ileocecal valve and terminal ileum.  We ran the small bowel proximally.  He was most of his ileum that appeared to be involved.  We did meticulous inspection of the small bowel.  There was some serosal adhesions and hernia sac adhesions that we had freed off.  There was some narrowing in one area but had dilated up with inspection.  Almost look like another band as well like a closed-loop type obstruction.  However the bowel perked up and looked markedly better and improved.  We did careful inspection and saw no serosal injury or enterotomy.  Dr. Gerrit Friends agreed.  We felt there is no need for small bowel resection.  Could see a normal-appearing uterus.  Could see blue monofilament Prolene like sutures on the rectal stump.  It did not appear inflamed.   We found no other interloop adhesions or other abnormalities.  We focused on the parastomal hernia again.  Freed the left colon adhesions to the hernia sac and help straighten it out.  While it was a large defect, we did not feel it was wise to place mesh in an emergent setting with inflammation and some bowel ischemia.  We decided to primarily repair this.  We used #1 PDS laparoscopically using an Endo Close suture passer from the left lateral side of the hernia sac.  We got 3 interrupted sutures clamped.  We confirmed that the colon was inferior to this superior vertical interrupted closure.  That snug down the hernia repair much better.  We held off on an inferior closure since the colon was more densely adherent inferomedially.  Once we placed the sutures we decompressed the abdomen and tied them down.  We reinsufflated and noted a good snug colon closure around the colon and colonic mesentery.  Laparoscopic rasper could not snugly, rounded consistent with a snug but not constrictive nor loose repair.  We inspected the small bowel intestine again confirmed no perforation, ischemia, nor persistent obstructive point.  Reassuring.  We brought the patient's large swath of greater omentum down from the upper abdomen and laid it over the small intestine and around the colon as well to discourage small bowel reincarceration.  Completed the TAP & regional field block.  We aspirated carbon dioxide.  Nasogastric tube did not seem to function well so asked anesthesia to replace with an 45 French tube.  Secured.  We closed the needle  puncture sites of the PDS with Monocryl.  Closed the 5 mm port sites x3 with Monocryl.  Sterile dressings applied.  Colostomy appliance applied.    Patient is being extubated to go to the recovery room.  I discussed operative findings, updated the patient's status, discussed probable steps to recovery, and gave postoperative recommendations to the  patient's friend, Lanice Shirts .   Recommendations were made.  Questions were answered.  She expressed understanding & appreciation.  Ardeth Sportsman, M.D., F.A.C.S. Gastrointestinal and Minimally Invasive Surgery [facility] [address], [address] [place], [place] [address] [phone] Main / Paging  11/13/2021 4:17 PM

## 2021-11-13 NOTE — H&P (View-Only) (Signed)
? ? ? ? 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 ?Subjective: ?Patient in bed, family at bedside.  No flatus or BM.  Mild pain.  Some BRB per rectum overnight. ? ?Objective: ?Vital signs in last 24 hours: ?Temp:  [97.8 ?F (36.6 ?C)-98.2 ?F (36.8 ?C)] 98.2 ?F (36.8 ?C) (03/07 2951) ?Pulse Rate:  [87-91] 91 (03/07 0512) ?Resp:  [16] 16 (03/07 0512) ?BP: (147-157)/(77-88) 151/88 (03/07 0512) ?SpO2:  [95 %-98 %] 98 % (03/07 0512) ?Last BM Date : 11/10/21 ? ?Intake/Output from previous day: ?03/06 0701 - 03/07 0700 ?In: 877.2 [I.V.:877.2] ?Out: 1550 [Urine:1100; Emesis/NG output:450] ?Intake/Output this shift: ?Total I/O ?In: -  ?Out: 50 [Emesis/NG output:50] ? ?Physical Exam: ?Abdomen - soft, mild distension; stoma viable, no output; parastomal hernia mildly tender, not reducible ?Neuro - alert & oriented, no focal deficits ? ?Lab Results:  ?Recent Labs  ?  11/10/21 ?1454 11/11/21 ?8841  ?WBC 9.9 9.6  ?HGB 13.5 12.7  ?HCT 41.1 39.3  ?PLT 422* 362  ? ?BMET ?Recent Labs  ?  11/11/21 ?6606 11/13/21 ?0428  ?NA 138 138  ?K 3.8 3.6  ?CL 104 105  ?CO2 24 25  ?GLUCOSE 154* 110*  ?BUN 14 18  ?CREATININE 0.75 0.70  ?CALCIUM 9.3 8.7*  ? ?PT/INR ?No results for input(s): LABPROT, INR in the last 72 hours. ?Comprehensive Metabolic Panel: ?   ?Component Value Date/Time  ? NA 138 11/13/2021 0428  ? NA 138 11/11/2021 0417  ? NA 138 05/20/2019 1527  ? K 3.6 11/13/2021 0428  ? K 3.8 11/11/2021 0417  ? CL 105 11/13/2021 0428  ? CL 104 11/11/2021 0417  ? CO2 25 11/13/2021 0428  ? CO2 24 11/11/2021 0417  ? BUN 18 11/13/2021 0428  ? BUN 14 11/11/2021 0417  ? BUN 9 05/20/2019 1527  ? CREATININE 0.70 11/13/2021 0428  ? CREATININE 0.75 11/11/2021 0417  ? GLUCOSE 110 (H) 11/13/2021 0428  ? GLUCOSE 154 (H) 11/11/2021 0417  ? CALCIUM 8.7 (L) 11/13/2021 0428  ? CALCIUM 9.3 11/11/2021 0417  ? AST 24 11/10/2021 1454  ? AST 23 03/18/2021 1950  ? ALT 20 11/10/2021 1454  ? ALT 22 03/18/2021 1950  ? ALKPHOS 71 11/10/2021  1454  ? ALKPHOS 65 03/18/2021 1950  ? BILITOT 0.4 11/10/2021 1454  ? BILITOT 0.5 03/18/2021 1950  ? BILITOT <0.2 05/20/2019 1527  ? PROT 8.2 (H) 11/10/2021 1454  ? PROT 7.4 03/18/2021 1950  ? PROT 6.7 05/20/2019 1527  ? ALBUMIN 4.2 11/10/2021 1454  ? ALBUMIN 4.0 03/18/2021 1950  ? ALBUMIN 3.9 05/20/2019 1527  ? ? ?Studies/Results: ?DG Abd 1 View ? ?Result Date: 11/11/2021 ?CLINICAL DATA:  NG tube placement. EXAM: ABDOMEN - 1 VIEW COMPARISON:  11/11/2021. FINDINGS: There is mildly distended small bowel measuring up to 3.9 cm in diameter with retained contrast in the mid left abdomen. An enteric tube terminates in the stomach. A cluster of calcifications are present in the right upper quadrant suggesting cholelithiasis. IMPRESSION: 1. Enteric tube terminates in the stomach. 2. Mildly distended small bowel measuring up to 3.9 cm in diameter. 3. Cholelithiasis. Electronically Signed   By: Thornell Sartorius M.D.   On: 11/11/2021 20:23  ? ?DG Abd 2 Views ? ?Result Date: 11/12/2021 ?CLINICAL DATA:  History of ulcerative colitis post colostomy. Current abdominal pain with nausea and vomiting and decreased ostomy output. EXAM: ABDOMEN - 2 VIEW COMPARISON:  11/11/2021 and CT 11/10/2021 FINDINGS: Nasogastric tube in adequate position over the stomach in  the left upper quadrant. Evidence of cholelithiasis. Air and stool present within the colon. 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 No free peritoneal air. Remainder the exam is unchanged. 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 Electronically Signed   By: Daniel  Boyle M.D.   On: 11/12/2021 09:49    ? ?Assessment & Plan: ?HD#4 - parastomal hernia with small bowel obstruction ? NPO, NG decompression ? IV hydration ? Continues to have obstruction and not improving.  Will plan to proceed to OR today for fixation. ? Encouraged OOB, ambulation ? ?FEN: NPO/NGT/IVFs ?VTE: will start post op ?ID: Ancef on call to OR ? ?Ulcerative colitis - some BRBPR overnight.  Will monitor ? ?Moderate MDM ?Vitals, labs, I/Os, progress notes, care everywhere notes reviewed ? ?[name] ?11/13/2021 ? ? Patient ID: Lindsay Diaz, female   DOB: 06/14/1971, 51 y.o.   MRN: [id number] ? ?

## 2021-11-13 NOTE — Anesthesia Postprocedure Evaluation (Signed)
Anesthesia Post Note ? ?Patient: Lindsay Diaz ? ?Procedure(s) Performed: LAPAROSCOPIC REPAIR OF INCARCERATED  PARASTOMAL HERNIA  WITH LYSIS OF ADHESIONS, LEFT TAP BLACK (Abdomen) ? ?  ? ?Patient location during evaluation: PACU ?Anesthesia Type: General ?Level of consciousness: awake and alert ?Pain management: pain level controlled ?Vital Signs Assessment: post-procedure vital signs reviewed and stable ?Respiratory status: spontaneous breathing, nonlabored ventilation, respiratory function stable and patient connected to nasal cannula oxygen ?Cardiovascular status: blood pressure returned to baseline and stable ?Postop Assessment: no apparent nausea or vomiting ?Anesthetic complications: no ? ? ?No notable events documented. ? ?Last Vitals:  ?Vitals:  ? 11/13/21 1801 11/13/21 1857  ?BP: (!) 141/78 (!) 150/74  ?Pulse: 85 97  ?Resp: 20 19  ?Temp: 36.8 ?C 36.5 ?C  ?SpO2: 98% 98%  ?  ?Last Pain:  ?Vitals:  ? 11/13/21 1931  ?TempSrc:   ?PainSc: 2   ? ? ?  ?  ?  ?  ?  ?  ? ?Trevor Iha ? ? ? ? ?

## 2021-11-13 NOTE — Anesthesia Preprocedure Evaluation (Signed)
Anesthesia Evaluation  ?Patient identified by MRN, date of birth, ID band ?Patient awake ? ? ? ?Reviewed: ?Allergy & Precautions, NPO status , Patient's Chart, lab work & pertinent test results ? ?Airway ? ? ? ? ? ? ? Dental ?  ?Pulmonary ?neg pulmonary ROS,  ?  ? ? ? ? ? ? ? Cardiovascular ?hypertension, negative cardio ROS ? ? ? ? ?  ?Neuro/Psych ?PSYCHIATRIC DISORDERS Depression negative neurological ROS ?   ? GI/Hepatic ?Neg liver ROS, PUD, H/o UC ?  ?Endo/Other  ?Morbid obesity (BMI 41) ? Renal/GU ?negative Renal ROS  ?negative genitourinary ?  ?Musculoskeletal ?negative musculoskeletal ROS ?(+)  ? Abdominal ?  ?Peds ? Hematology ?negative hematology ROS ?(+)   ?Anesthesia Other Findings ?small bowel obstruction secondary to parastomal hernia ? Reproductive/Obstetrics ? ?  ? ? ? ? ? ? ? ? ? ? ? ? ? ?  ?  ? ? ? ? ? ? ? ? ?Anesthesia Physical ?Anesthesia Plan ? ?ASA: 3 ? ?Anesthesia Plan: General  ? ?Post-op Pain Management: Ofirmev IV (intra-op)* and Toradol IV (intra-op)*  ? ?Induction: Intravenous and Rapid sequence ? ?PONV Risk Score and Plan: 3 and Midazolam, Dexamethasone and Ondansetron ? ?Airway Management Planned: Oral ETT ? ?Additional Equipment:  ? ?Intra-op Plan:  ? ?Post-operative Plan: Extubation in OR ? ?Informed Consent: I have reviewed the patients History and Physical, chart, labs and discussed the procedure including the risks, benefits and alternatives for the proposed anesthesia with the patient or authorized representative who has indicated his/her understanding and acceptance.  ? ? ? ?Dental advisory given ? ?Plan Discussed with: CRNA ? ?Anesthesia Plan Comments:   ? ? ? ? ? ? ?Anesthesia Quick Evaluation ? ?

## 2021-11-13 NOTE — Progress Notes (Signed)
Patient seen at request of Dr. Harlow Asa and Ms. Osborne with the surgery team.  She is clearly more obstipated at this point and more distended.  I agree she needs reduction repair of hernia.  I can see if I am available this afternoon to try a laparoscopic possible open approach.  I think the best we can do is reduction and primary repair.  While failure rate is much higher compared to a mesh repair, placing mesh urgently seems more risky and I would hold off, especially in the light of possible undertreated ulcerative colitis. ? ?The anatomy & physiology of the abdominal wall was discussed.  The pathophysiology of hernias was discussed.  Natural history risks without surgery including progeressive enlargement, pain, incarceration, & strangulation was discussed.   Contributors to complications such as smoking, obesity, diabetes, prior surgery, etc were discussed.  ? ?I feel the risks of no intervention will lead to serious problems that outweigh the operative risks; therefore, I recommended surgery to reduce and repair the hernia.  I explained laparoscopic techniques with possible need for an open approach.  I noted the probable use of mesh to patch and/or buttress the hernia repair ? ?Risks such as bleeding, infection, abscess, need for further treatment, injury to other organs, need for repair of tissues / organs, stroke, heart attack, death, and other risks were discussed.  I noted a good likelihood this will help address the problem.   Goals of post-operative recovery were discussed as well.  Possibility that this will not correct all symptoms was explained.  I stressed the importance of low-impact activity, aggressive pain control, avoiding constipation, & not pushing through pain to minimize risk of post-operative chronic pain or injury. Possibility of reherniation especially with smoking, obesity, diabetes, immunosuppression, and other health conditions was discussed.  We will work to minimize complications.     ? ?An educational handout further explaining the pathology & treatment options was given as well.  Questions were answered.  The patient expresses understanding & wishes to proceed with surgery. ? ?Adin Hector, MD, FACS, MASCRS ?Esophageal, Gastrointestinal & Colorectal Surgery ?Robotic and Minimally Invasive Surgery ? ?[facility], [place] [address], [address] ?[place], [place] [address] ?[phone] Fax ?[phone] Main ? ?CONTACT INFORMATION: ? ?Weekday (9AM-5PM): Call [facility] main office at [phone] ? ?Weeknight (5PM-9AM) or Weekend/Holiday: Check [email or web address] (password " [email or web address]") for General Surgery [facility] coverage ? ?(Please, do not use SecureChat as it is not reliable communication to operating surgeons for immediate patient care) ?  ?  ?  ?

## 2021-11-13 NOTE — Anesthesia Preprocedure Evaluation (Addendum)
Anesthesia Evaluation  ?Patient identified by MRN, date of birth, ID band ?Patient awake ? ? ? ?Reviewed: ?Allergy & Precautions, NPO status , Patient's Chart, lab work & pertinent test results, reviewed documented beta blocker date and time  ? ?Airway ?Mallampati: III ? ?TM Distance: >3 FB ?Neck ROM: Full ? ? ? Dental ?no notable dental hx. ?(+) Teeth Intact, Dental Advisory Given ?  ?Pulmonary ?neg pulmonary ROS,  ?  ?Pulmonary exam normal ?breath sounds clear to auscultation ? ? ? ? ? ? Cardiovascular ?hypertension, Pt. on home beta blockers and Pt. on medications ?Normal cardiovascular exam ?Rhythm:Regular Rate:Normal ? ? ?  ?Neuro/Psych ?PSYCHIATRIC DISORDERS Depression negative neurological ROS ?   ? GI/Hepatic ?Neg liver ROS, PUD, H/o UC ?  ?Endo/Other  ?Morbid obesity (BMI 41) ? Renal/GU ?negative Renal ROS  ?negative genitourinary ?  ?Musculoskeletal ?negative musculoskeletal ROS ?(+)  ? Abdominal ?  ?Peds ? Hematology ?negative hematology ROS ?(+)   ?Anesthesia Other Findings ?SBO from parastomal hernia ? Reproductive/Obstetrics ? ?  ? ? ? ? ? ? ? ? ? ? ? ? ? ?  ?  ? ? ? ? ? ? ? ?Anesthesia Physical ?Anesthesia Plan ? ?ASA: 3 ? ?Anesthesia Plan: General  ? ?Post-op Pain Management: Ofirmev IV (intra-op)* and Lidocaine infusion*  ? ?Induction: Intravenous ? ?PONV Risk Score and Plan: 3 and Midazolam, Dexamethasone and Treatment may vary due to age or medical condition ? ?Airway Management Planned: Oral ETT and Video Laryngoscope Planned ? ?Additional Equipment:  ? ?Intra-op Plan:  ? ?Post-operative Plan: Extubation in OR ? ?Informed Consent: I have reviewed the patients History and Physical, chart, labs and discussed the procedure including the risks, benefits and alternatives for the proposed anesthesia with the patient or authorized representative who has indicated his/her understanding and acceptance.  ? ? ? ?Dental advisory given ? ?Plan Discussed with:  CRNA ? ?Anesthesia Plan Comments:   ? ? ? ? ? ?Anesthesia Quick Evaluation ? ?

## 2021-11-13 NOTE — Progress Notes (Signed)
PROGRESS NOTE    Lindsay Diaz  WJX:[id number] DOB: 11-Aug-1971 DOA: 11/10/2021 PCP: [facility]   Brief Narrative: Lindsay Diaz is a 51 y.o. female with a history of ulcerative colitis s/p colostomy, hypertension, depression. Patient presented secondary to abdominal pain, nausea, vomiting and decreased ostomy output. CT scan significant for small bowel obstruction. General surgery consulted. NG tube placed. Bowel rest. Patient without improvement of SBO and is now requiring surgical management.   Assessment and Plan: * SBO (small bowel obstruction) (HCC) CT abdomen significant for SBO with herniated small bowel. General surgery consulted. NG tube placed and patient made NPO. Bowel rest.  -General surgery recommendations: laparoscopic surgery planned for today -IV fluids  Morbid obesity with body mass index (BMI) of 40.0 to 49.9 (HCC) Body mass index is 43.85 kg/m.  UC (ulcerative colitis) (HCC) Patient not currently on therapy. Previously on mesalamine. Has a history of prednisone use in the past. Recent colonoscopy significant for ulcers noted in the rectum -Anusol BID x3 days  Asymptomatic bacteriuria Noted.  HTN (hypertension) Patient is on atenolol as an outpatient which is held on admission secondary to SBO.  -IV hydralazine prn SBP >160  Thrombocytosis-resolved as of 11/11/2021 Transient. Resolved.    DVT prophylaxis: SCDs Code Status:   Code Status: Full Code Family Communication: Son at bedside Disposition Plan: Discharge home pending improvement of SBO/general surgery recommendations   Consultants:  General surgery  Procedures:  NG Tube  Antimicrobials: None    Subjective: Patient without bowel function. Worsening distension. Patient reports some blood per rectum with wiping. No frank bloody output.  Objective: BP (!) 151/88 (BP Location: Left Arm)    Pulse 91    Temp 98.2 F (36.8 C) (Oral)    Resp 16    Ht 5\' 7"   (1.702 m)    Wt 119 kg    SpO2 98%    BMI 41.09 kg/m   Examination:  General exam: Appears calm and comfortable Respiratory system: Clear to auscultation. Respiratory effort normal. Cardiovascular system: S1 & S2 heard, RRR. Gastrointestinal system: Abdomen is distended, soft and tender in LUQ. Decreased/minimal bowel sounds heard. Central nervous system: Alert and oriented. No focal neurological deficits. Musculoskeletal: No edema. No calf tenderness Skin: No cyanosis. No rashes Psychiatry: Judgement and insight appear normal. Mood & affect appropriate.    Data Reviewed: I have personally reviewed following labs and imaging studies  CBC Lab Results  Component Value Date   WBC 9.6 11/11/2021   RBC 4.45 11/11/2021   HGB 12.7 11/11/2021   HCT 39.3 11/11/2021   MCV 88.3 11/11/2021   MCH 28.5 11/11/2021   PLT 362 11/11/2021   MCHC 32.3 11/11/2021   RDW 13.2 11/11/2021   LYMPHSABS 1.4 03/18/2021   MONOABS 0.6 03/18/2021   EOSABS 0.2 03/18/2021   BASOSABS 0.0 03/18/2021     Last metabolic panel Lab Results  Component Value Date   NA 138 11/13/2021   K 3.6 11/13/2021   CL 105 11/13/2021   CO2 25 11/13/2021   BUN 18 11/13/2021   CREATININE 0.70 11/13/2021   GLUCOSE 110 (H) 11/13/2021   GFRNONAA >60 11/13/2021   GFRAA 91 05/20/2019   CALCIUM 8.7 (L) 11/13/2021   PROT 8.2 (H) 11/10/2021   ALBUMIN 4.2 11/10/2021   LABGLOB 2.8 05/20/2019   AGRATIO 1.4 05/20/2019   BILITOT 0.4 11/10/2021   ALKPHOS 71 11/10/2021   AST 24 11/10/2021   ALT 20 11/10/2021   ANIONGAP 8 11/13/2021  GFR: Estimated Creatinine Clearance: 109.9 mL/min (by C-G formula based on SCr of 0.7 mg/dL).  Recent Results (from the past 240 hour(s))  Resp Panel by RT-PCR (Flu A&B, Covid) Nasopharyngeal Swab     Status: None   Collection Time: 11/10/21  4:58 PM   Specimen: Nasopharyngeal Swab; Nasopharyngeal(NP) swabs in vial transport medium  Result Value Ref Range Status   SARS Coronavirus 2 by RT  PCR NEGATIVE NEGATIVE Final    Comment: (NOTE) SARS-CoV-2 target nucleic acids are NOT DETECTED.  The SARS-CoV-2 RNA is generally detectable in upper respiratory specimens during the acute phase of infection. The lowest concentration of SARS-CoV-2 viral copies this assay can detect is 138 copies/mL. A negative result does not preclude SARS-Cov-2 infection and should not be used as the sole basis for treatment or other patient management decisions. A negative result may occur with  improper specimen collection/handling, submission of specimen other than nasopharyngeal swab, presence of viral mutation(s) within the areas targeted by this assay, and inadequate number of viral copies(<138 copies/mL). A negative result must be combined with clinical observations, patient history, and epidemiological information. The expected result is Negative.  Fact Sheet for Patients:  BloggerCourse.com  Fact Sheet for Healthcare Providers:  SeriousBroker.it  This test is no t yet approved or cleared by the [place] FDA and  has been authorized for detection and/or diagnosis of SARS-CoV-2 by FDA under an Emergency Use Authorization (EUA). This EUA will remain  in effect (meaning this test can be used) for the duration of the COVID-19 declaration under Section 564(b)(1) of the Act, 21 U.S.C.section 360bbb-3(b)(1), unless the authorization is terminated  or revoked sooner.       Influenza A by PCR NEGATIVE NEGATIVE Final   Influenza B by PCR NEGATIVE NEGATIVE Final    Comment: (NOTE) The Xpert Xpress SARS-CoV-2/FLU/RSV plus assay is intended as an aid in the diagnosis of influenza from Nasopharyngeal swab specimens and should not be used as a sole basis for treatment. Nasal washings and aspirates are unacceptable for Xpert Xpress SARS-CoV-2/FLU/RSV testing.  Fact Sheet for Patients: BloggerCourse.com  Fact Sheet for  Healthcare Providers: SeriousBroker.it  This test is not yet approved or cleared by the [place] FDA and has been authorized for detection and/or diagnosis of SARS-CoV-2 by FDA under an Emergency Use Authorization (EUA). This EUA will remain in effect (meaning this test can be used) for the duration of the COVID-19 declaration under Section 564(b)(1) of the Act, 21 U.S.C. section 360bbb-3(b)(1), unless the authorization is terminated or revoked.  Performed at [facility], [address] [place], [place] [address]       Radiology Studies: DG Abd 1 View  Result Date: 11/11/2021 CLINICAL DATA:  NG tube placement. EXAM: ABDOMEN - 1 VIEW COMPARISON:  11/11/2021. FINDINGS: There is mildly distended small bowel measuring up to 3.9 cm in diameter with retained contrast in the mid left abdomen. An enteric tube terminates in the stomach. A cluster of calcifications are present in the right upper quadrant suggesting cholelithiasis. IMPRESSION: 1. Enteric tube terminates in the stomach. 2. Mildly distended small bowel measuring up to 3.9 cm in diameter. 3. Cholelithiasis. Electronically Signed   By: Thornell Sartorius M.D.   On: 11/11/2021 20:23   DG Abd 2 Views  Result Date: 11/12/2021 CLINICAL DATA:  History of ulcerative colitis post colostomy. Current abdominal pain with nausea and vomiting and decreased ostomy output. EXAM: ABDOMEN - 2 VIEW COMPARISON:  11/11/2021 and CT 11/10/2021 FINDINGS:  Nasogastric tube in adequate position over the stomach in the left upper quadrant. Evidence of cholelithiasis. Air and stool present within the colon. 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 No free peritoneal air. Remainder the exam is unchanged. IMPRESSION: Persistent air and  contrast filled dilated small bowel loops in the left abdomen compatible with recent CT findings of small bowel obstruction associated with patient's stomal hernia in the left mid to lower abdomen. Electronically Signed   By: Elberta Fortis M.D.   On: 11/12/2021 09:49      LOS: 3 days    Jacquelin Hawking, MD [facility] 11/13/2021, 12:00 PM   If 7PM-7AM, please contact night-coverage [email or web address]

## 2021-11-13 NOTE — Progress Notes (Signed)
? ? ? ? 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 ?Subjective: ?Patient in bed, family at bedside.  No flatus or BM.  Mild pain.  Some BRB per rectum overnight. ? ?Objective: ?Vital signs in last 24 hours: ?Temp:  [97.8 ?F (36.6 ?C)-98.2 ?F (36.8 ?C)] 98.2 ?F (36.8 ?C) (03/07 2951) ?Pulse Rate:  [87-91] 91 (03/07 0512) ?Resp:  [16] 16 (03/07 0512) ?BP: (147-157)/(77-88) 151/88 (03/07 0512) ?SpO2:  [95 %-98 %] 98 % (03/07 0512) ?Last BM Date : 11/10/21 ? ?Intake/Output from previous day: ?03/06 0701 - 03/07 0700 ?In: 877.2 [I.V.:877.2] ?Out: 1550 [Urine:1100; Emesis/NG output:450] ?Intake/Output this shift: ?Total I/O ?In: -  ?Out: 50 [Emesis/NG output:50] ? ?Physical Exam: ?Abdomen - soft, mild distension; stoma viable, no output; parastomal hernia mildly tender, not reducible ?Neuro - alert & oriented, no focal deficits ? ?Lab Results:  ?Recent Labs  ?  11/10/21 ?1454 11/11/21 ?8841  ?WBC 9.9 9.6  ?HGB 13.5 12.7  ?HCT 41.1 39.3  ?PLT 422* 362  ? ?BMET ?Recent Labs  ?  11/11/21 ?6606 11/13/21 ?0428  ?NA 138 138  ?K 3.8 3.6  ?CL 104 105  ?CO2 24 25  ?GLUCOSE 154* 110*  ?BUN 14 18  ?CREATININE 0.75 0.70  ?CALCIUM 9.3 8.7*  ? ?PT/INR ?No results for input(s): LABPROT, INR in the last 72 hours. ?Comprehensive Metabolic Panel: ?   ?Component Value Date/Time  ? NA 138 11/13/2021 0428  ? NA 138 11/11/2021 0417  ? NA 138 05/20/2019 1527  ? K 3.6 11/13/2021 0428  ? K 3.8 11/11/2021 0417  ? CL 105 11/13/2021 0428  ? CL 104 11/11/2021 0417  ? CO2 25 11/13/2021 0428  ? CO2 24 11/11/2021 0417  ? BUN 18 11/13/2021 0428  ? BUN 14 11/11/2021 0417  ? BUN 9 05/20/2019 1527  ? CREATININE 0.70 11/13/2021 0428  ? CREATININE 0.75 11/11/2021 0417  ? GLUCOSE 110 (H) 11/13/2021 0428  ? GLUCOSE 154 (H) 11/11/2021 0417  ? CALCIUM 8.7 (L) 11/13/2021 0428  ? CALCIUM 9.3 11/11/2021 0417  ? AST 24 11/10/2021 1454  ? AST 23 03/18/2021 1950  ? ALT 20 11/10/2021 1454  ? ALT 22 03/18/2021 1950  ? ALKPHOS 71 11/10/2021  1454  ? ALKPHOS 65 03/18/2021 1950  ? BILITOT 0.4 11/10/2021 1454  ? BILITOT 0.5 03/18/2021 1950  ? BILITOT <0.2 05/20/2019 1527  ? PROT 8.2 (H) 11/10/2021 1454  ? PROT 7.4 03/18/2021 1950  ? PROT 6.7 05/20/2019 1527  ? ALBUMIN 4.2 11/10/2021 1454  ? ALBUMIN 4.0 03/18/2021 1950  ? ALBUMIN 3.9 05/20/2019 1527  ? ? ?Studies/Results: ?DG Abd 1 View ? ?Result Date: 11/11/2021 ?CLINICAL DATA:  NG tube placement. EXAM: ABDOMEN - 1 VIEW COMPARISON:  11/11/2021. FINDINGS: There is mildly distended small bowel measuring up to 3.9 cm in diameter with retained contrast in the mid left abdomen. An enteric tube terminates in the stomach. A cluster of calcifications are present in the right upper quadrant suggesting cholelithiasis. IMPRESSION: 1. Enteric tube terminates in the stomach. 2. Mildly distended small bowel measuring up to 3.9 cm in diameter. 3. Cholelithiasis. Electronically Signed   By: Thornell Sartorius M.D.   On: 11/11/2021 20:23  ? ?DG Abd 2 Views ? ?Result Date: 11/12/2021 ?CLINICAL DATA:  History of ulcerative colitis post colostomy. Current abdominal pain with nausea and vomiting and decreased ostomy output. EXAM: ABDOMEN - 2 VIEW COMPARISON:  11/11/2021 and CT 11/10/2021 FINDINGS: Nasogastric tube in adequate position over the stomach in  the left upper quadrant. Evidence of cholelithiasis. Air and stool present within the colon. 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 No free peritoneal air. Remainder the exam is unchanged. 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 Electronically Signed   By: Elberta Fortis M.D.   On: 11/12/2021 09:49    ? ?Assessment & Plan: ?HD#4 - parastomal hernia with small bowel obstruction ? NPO, NG decompression ? IV hydration ? Continues to have obstruction and not improving.  Will plan to proceed to OR today for fixation. ? Encouraged OOB, ambulation ? ?FEN: NPO/NGT/IVFs ?VTE: will start post op ?ID: Ancef on call to OR ? ?Ulcerative colitis - some BRBPR overnight.  Will monitor ? ?Moderate MDM ?Vitals, labs, I/Os, progress notes, care everywhere notes reviewed ? ?Letha Cape ?11/13/2021 ? ? Patient ID: Aide Wojnar, female   DOB: [birth year], 51 y.o.   MRN: [id number] ? ?

## 2021-11-13 NOTE — Transfer of Care (Signed)
Immediate Anesthesia Transfer of Care Note ? ?Patient: Lindsay Diaz ? ?Procedure(s) Performed: LAPAROSCOPIC REPAIR OF INCARCERATED  PARASTOMAL HERNIA  WITH LYSIS OF ADHESIONS, LEFT TAP BLACK (Abdomen) ? ?Patient Location: PACU ? ?Anesthesia Type:General ? ?Level of Consciousness: awake, alert  and oriented ? ?Airway & Oxygen Therapy: Patient Spontanous Breathing and Patient connected to face mask oxygen ? ?Post-op Assessment: Report given to RN and Post -op Vital signs reviewed and stable ? ?Post vital signs: Reviewed and stable ? ?Last Vitals:  ?Vitals Value Taken Time  ?BP 135/88 11/13/21 1623  ?Temp    ?Pulse 116 11/13/21 1625  ?Resp 21 11/13/21 1625  ?SpO2 100 % 11/13/21 1625  ?Vitals shown include unvalidated device data. ? ?Last Pain:  ?Vitals:  ? 11/13/21 0745  ?TempSrc:   ?PainSc: 3   ?   ? ?Patients Stated Pain Goal: 0 (11/13/21 0745) ? ?Complications: No notable events documented. ?

## 2021-11-14 ENCOUNTER — Encounter (HOSPITAL_COMMUNITY): Payer: Self-pay | Admitting: Surgery

## 2021-11-14 DIAGNOSIS — K56609 Unspecified intestinal obstruction, unspecified as to partial versus complete obstruction: Secondary | ICD-10-CM | POA: Diagnosis not present

## 2021-11-14 DIAGNOSIS — E876 Hypokalemia: Secondary | ICD-10-CM | POA: Diagnosis not present

## 2021-11-14 LAB — CBC
HCT: 39.4 % (ref 36.0–46.0)
Hemoglobin: 12.2 g/dL (ref 12.0–15.0)
MCH: 28.4 pg (ref 26.0–34.0)
MCHC: 31 g/dL (ref 30.0–36.0)
MCV: 91.6 fL (ref 80.0–100.0)
Platelets: 297 10*3/uL (ref 150–400)
RBC: 4.3 MIL/uL (ref 3.87–5.11)
RDW: 13.6 % (ref 11.5–15.5)
WBC: 8.9 10*3/uL (ref 4.0–10.5)
nRBC: 0 % (ref 0.0–0.2)

## 2021-11-14 LAB — BASIC METABOLIC PANEL
Anion gap: 8 (ref 5–15)
BUN: 14 mg/dL (ref 6–20)
CO2: 26 mmol/L (ref 22–32)
Calcium: 8.5 mg/dL — ABNORMAL LOW (ref 8.9–10.3)
Chloride: 106 mmol/L (ref 98–111)
Creatinine, Ser: 0.77 mg/dL (ref 0.44–1.00)
GFR, Estimated: >60 mL/min (ref >60)
Glucose, Bld: 102 mg/dL — ABNORMAL HIGH (ref 70–99)
Potassium: 3.4 mmol/L — ABNORMAL LOW (ref 3.5–5.1)
Sodium: 140 mmol/L (ref 135–145)

## 2021-11-14 LAB — MAGNESIUM: Magnesium: 2.2 mg/dL (ref 1.7–2.4)

## 2021-11-14 MED ORDER — PANTOPRAZOLE SODIUM 40 MG IV SOLR
40.0000 mg | Freq: Every day | INTRAVENOUS | Status: DC
  Administered 2021-11-14 – 2021-11-16 (×3): 40 mg via INTRAVENOUS
  Filled 2021-11-14 (×3): qty 10

## 2021-11-14 MED ORDER — ENOXAPARIN SODIUM 40 MG/0.4ML IJ SOSY
40.0000 mg | PREFILLED_SYRINGE | INTRAMUSCULAR | Status: DC
  Administered 2021-11-14 – 2021-11-16 (×3): 40 mg via SUBCUTANEOUS
  Filled 2021-11-14 (×4): qty 0.4

## 2021-11-14 MED ORDER — POTASSIUM CHLORIDE 10 MEQ/100ML IV SOLN
10.0000 meq | INTRAVENOUS | Status: AC
  Administered 2021-11-14 (×4): 10 meq via INTRAVENOUS
  Filled 2021-11-14 (×4): qty 100

## 2021-11-14 MED ORDER — LACTATED RINGERS IV SOLN: INTRAVENOUS | Status: DC

## 2021-11-14 NOTE — Progress Notes (Signed)
? ? ? ? 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 ?Subjective: ?Patient feels much better today.  Pain is well controlled and not having much of it.  Already having some bowel function in her colostomy. ? ?Objective: ?Vital signs in last 24 hours: ?Temp:  [97.7 ?F (36.5 ?C)-98.6 ?F (37 ?C)] 98.5 ?F (36.9 ?C) (03/08 3419) ?Pulse Rate:  [74-121] 82 (03/08 0957) ?Resp:  [16-23] 16 (03/08 0957) ?BP: (128-187)/(73-97) 147/76 (03/08 0957) ?SpO2:  [92 %-100 %] 100 % (03/08 0957) ?Last BM Date : 11/13/21 (output from colostomy) ? ?Intake/Output from previous day: ?03/07 0701 - 03/08 0700 ?In: 3861.1 [P.O.:60; I.V.:3601.1; IV Piggyback:200] ?Out: 1790 [Urine:1040; Emesis/NG output:670; Blood:30] ?Intake/Output this shift: ?Total I/O ?In: -  ?Out: 50 [Stool:50] ? ?Physical Exam: ?Abdomen - soft, incisions c/d/I.  Hernia no longer present.  Colostomy present and bag just changed, so no output currently but patient had output in other bag.  NGT with bilious output present, slightly blood tinged ? ? ?Lab Results:  ?Recent Labs  ?  11/14/21 ?0425  ?WBC 8.9  ?HGB 12.2  ?HCT 39.4  ?PLT 297  ? ?BMET ?Recent Labs  ?  11/13/21 ?0428 11/14/21 ?0425  ?NA 138 140  ?K 3.6 3.4*  ?CL 105 106  ?CO2 25 26  ?GLUCOSE 110* 102*  ?BUN 18 14  ?CREATININE 0.70 0.77  ?CALCIUM 8.7* 8.5*  ? ?PT/INR ?No results for input(s): LABPROT, INR in the last 72 hours. ?Comprehensive Metabolic Panel: ?   ?Component Value Date/Time  ? NA 140 11/14/2021 0425  ? NA 138 11/13/2021 0428  ? NA 138 05/20/2019 1527  ? K 3.4 (L) 11/14/2021 0425  ? K 3.6 11/13/2021 0428  ? CL 106 11/14/2021 0425  ? CL 105 11/13/2021 0428  ? CO2 26 11/14/2021 0425  ? CO2 25 11/13/2021 0428  ? BUN 14 11/14/2021 0425  ? BUN 18 11/13/2021 0428  ? BUN 9 05/20/2019 1527  ? CREATININE 0.77 11/14/2021 0425  ? CREATININE 0.70 11/13/2021 0428  ? GLUCOSE 102 (H) 11/14/2021 0425  ? GLUCOSE 110 (H) 11/13/2021 0428  ? CALCIUM 8.5 (L) 11/14/2021 0425  ? CALCIUM 8.7 (L)  11/13/2021 0428  ? AST 24 11/10/2021 1454  ? AST 23 03/18/2021 1950  ? ALT 20 11/10/2021 1454  ? ALT 22 03/18/2021 1950  ? ALKPHOS 71 11/10/2021 1454  ? ALKPHOS 65 03/18/2021 1950  ? BILITOT 0.4 11/10/2021 1454  ? BILITOT 0.5 03/18/2021 1950  ? BILITOT <0.2 05/20/2019 1527  ? PROT 8.2 (H) 11/10/2021 1454  ? PROT 7.4 03/18/2021 1950  ? PROT 6.7 05/20/2019 1527  ? ALBUMIN 4.2 11/10/2021 1454  ? ALBUMIN 4.0 03/18/2021 1950  ? ALBUMIN 3.9 05/20/2019 1527  ? ? ?Studies/Results: ?DG Abd Portable 1V ? ?Result Date: 11/13/2021 ?CLINICAL DATA:  NG placement EXAM: PORTABLE ABDOMEN - 1 VIEW COMPARISON:  None. FINDINGS: Enteric tube passes into the stomach. Bowel gas pattern is not well evaluated. IMPRESSION: Enteric tube passes into the stomach. Electronically Signed   By: Guadlupe Spanish M.D.   On: 11/13/2021 18:57   ? ?Assessment & Plan: ?POD 1, s/p laparoscopic reduction of parastomal hernia and primary repair by Dr. Michaell Cowing 11/13/21 for SBO secondary to parastomal hernia ?-cont NGT today due to bowel dilatation in OR and NGT output. Hopefully can DC in am ?-DC foley today ?-replace K today to help with gut function, likely loss from GI ?-mobilize/pulm toilet ?-no abx needed ? ?FEN: NPO/NGT/IVFs ?VTE: lovenox ?ID: Ancef on  call to OR only ? ?Ulcerative colitis - some BRBPR 2 nights ago, but no further, anusol per medicine ? ? ?Letha Cape ?11/14/2021 ? ? Patient ID: Prabhnoor Ellenberger, female   DOB: [birth year], 51 y.o.   MRN: [id number] ? ?

## 2021-11-14 NOTE — Progress Notes (Signed)
Mobility Specialist - Progress Note ? ? ? 11/14/21 1330  ?Mobility  ?Activity Ambulated independently in hallway;Ambulated independently in room  ?Level of Assistance Independent after set-up  ?Assistive Device Other (Comment) ?(IV Pole)  ?Distance Ambulated (ft) 450 ft  ?Activity Response Tolerated well  ?$Mobility charge 1 Mobility  ? ?Pt agreeable to ambulate upon entry. Pt pushed IV pole while ambulating 450 ft, no complaints made during session. Pt returned to room at EOS and left in bed with call bell at side and reconnected to NGT.  ? ?Luma Clopper S. ?Mobility Specialist ?[facility]ute [facility] Services ?Phone: [phone] ?11/14/21, 1:33 PM ? ? ? ?

## 2021-11-14 NOTE — Assessment & Plan Note (Addendum)
Now replaced. Monitor. ?

## 2021-11-14 NOTE — Progress Notes (Signed)
?  Progress Note ? ? ?Patient: Lindsay Diaz [id number] DOB: May 21, 1971 DOA: 11/10/2021     Hospitalization day: 4 ?DOS: the patient was seen and examined on 11/14/2021 ?  ?Brief hospital course: ?Clytie Shetley is a 51 y.o. female with a history of ulcerative colitis s/p colostomy, hypertension, depression. Patient presented secondary to abdominal pain, nausea, vomiting and decreased ostomy output. CT scan significant for small bowel obstruction. General surgery consulted. NG tube placed. Bowel rest. Patient without improvement of SBO and requiring surgical management. ?3/7 S/P laparoscopic primary repair of parastomal hernia and lysis of adhesion. ? ?Assessment and Plan: ?* SBO (small bowel obstruction) secondary to parastomal hernia. ?s/p laparoscopic reduction of parastomal hernia and primary repair by Dr. Michaell Cowing 11/13/21 ?CT abdomen significant for SBO with herniated small bowel.  ?General surgery consulted.  ?NG tube placed and patient made NPO. ?Due to no improvement General surgery recommended laparoscopic repair of the parastomal hernia. ?Tolerated procedure very well. ?Currently passing gas in the colostomy bag. ?Management per surgery. ? ?Hypokalemia ?Corrected.  Change IV fluids from half-normal saline to LR. ? ?Morbid obesity with body mass index (BMI) of 40.0 to 49.9 (HCC) ?Body mass index is 41.09 kg/m?Marland Kitchen  ?Placing the pt at higher risk of poor outcomes. ? ?UC (ulcerative colitis) (HCC) ?Patient not currently on therapy. Previously on mesalamine. Has a history of prednisone use in the past. Recent colonoscopy significant for ulcers noted in the rectum ?-Anusol BID x3 days ? ?Asymptomatic bacteriuria ?Noted. ? ?HTN (hypertension) ?Patient is on atenolol as an outpatient which is held on admission secondary to SBO.  ?-IV hydralazine prn SBP >160 ? ?Thrombocytosis-resolved as of 11/11/2021 ?Transient. Resolved. ? ?Subjective: No nausea no vomiting no fever no chills.  Pain well controlled.  Still passing gas.   No blood in the stoma. ? ?Physical Exam: ?Vitals:  ? 11/14/21 0135 11/14/21 0552 11/14/21 0957 11/14/21 1414  ?BP: 128/78 (!) 165/87 (!) 147/76 136/74  ?Pulse: 77 74 82 93  ?Resp: 16 18 16 16   ?Temp: 97.9 ?F (36.6 ?C) 98.5 ?F (36.9 ?C)  98.8 ?F (37.1 ?C)  ?TempSrc:  Oral  Oral  ?SpO2: 96% 100% 100% 100%  ?Weight:      ?Height:      ? ?General: Appear in mild distress; no visible Abnormal Neck Mass Or lumps, Conjunctiva normal ?Cardiovascular: S1 and S2 Present, no Murmur, ?Respiratory: good respiratory effort, Bilateral Air entry present and CTA, no Crackles, no wheezes ?Abdomen: Bowel Sound present ?Extremities: no Pedal edema ?Neurology: alert and oriented to time, place, and person ?Gait not checked due to patient safety concerns  ? ?Data Reviewed: ? I have Reviewed nursing notes, Vitals, and Lab results since pt's last encounter. Pertinent lab results CBC and BMP ?I have ordered test including CBC and BMP ?I have reviewed the last note from general surgery,  ?I have discussed pt's care plan and test results with general surgery.  ? ?Family Communication: Son at bedside ? ?Disposition: ?Status is: Inpatient ?Remains inpatient appropriate because: Still n.p.o. with NG tube  ? ?Author: ? , MD ?11/14/2021 6:27 PM ? ?For on call review [email or web address]. ?

## 2021-11-14 NOTE — Consult Note (Addendum)
[facility] Nurse ostomy consult note ?Pt was independent with pouch application and emptying prior to admission, she had colostomy surgery approx 5 years ago in [place], according to progress notes.  Requested to assess stoma port-op after hernia repair surgery and determine if she needs any revision to her current pouching routine. She states she has been using a 2 piece moldable pouching sytem from [employer] with a barrier ring prior to admission, and the stoma was 37mm  She will be able to use her current supplies after discharge, but I informed her we do not carry these in our [place] and we will substitute [employer] products.  ?Stoma type/location: Colostomy stoma is red and viable, flush with skin level, 3/4 inches ?Peristomal assessment: intact skin surrounding stoma ?Output: 50cc liquid brown stool in pouch ?Ostomy pouching: 2pc.  ?Education provided: Applied barrier ring and 2 piece pouching system. 3 sets of supplies left at the bedside for patient and staff nurses use: Use supplies: barrier ring, Kellie [employer]mer[employer] # [phone], wafer Kellie Simmering # [phone], pouch [employer] # [phone]. ?Pt denies questions or need for need for further teaching. ?Please re-consult if further assistance is needed.  Thank-you,  ?Julien Girt MSN, RN, [place], [place], CNS ?[phone]  ?

## 2021-11-15 LAB — BASIC METABOLIC PANEL
Anion gap: 7 (ref 5–15)
BUN: 11 mg/dL (ref 6–20)
CO2: 24 mmol/L (ref 22–32)
Calcium: 8.4 mg/dL — ABNORMAL LOW (ref 8.9–10.3)
Chloride: 105 mmol/L (ref 98–111)
Creatinine, Ser: 0.57 mg/dL (ref 0.44–1.00)
GFR, Estimated: >60 mL/min (ref >60)
Glucose, Bld: 80 mg/dL (ref 70–99)
Potassium: 3.3 mmol/L — ABNORMAL LOW (ref 3.5–5.1)
Sodium: 136 mmol/L (ref 135–145)

## 2021-11-15 LAB — CBC
HCT: 38.5 % (ref 36.0–46.0)
Hemoglobin: 12.2 g/dL (ref 12.0–15.0)
MCH: 28.3 pg (ref 26.0–34.0)
MCHC: 31.7 g/dL (ref 30.0–36.0)
MCV: 89.3 fL (ref 80.0–100.0)
Platelets: 276 10*3/uL (ref 150–400)
RBC: 4.31 MIL/uL (ref 3.87–5.11)
RDW: 13.3 % (ref 11.5–15.5)
WBC: 7 10*3/uL (ref 4.0–10.5)
nRBC: 0 % (ref 0.0–0.2)

## 2021-11-15 LAB — MAGNESIUM: Magnesium: 1.9 mg/dL (ref 1.7–2.4)

## 2021-11-15 MED ORDER — POTASSIUM CHLORIDE 10 MEQ/100ML IV SOLN
10.0000 meq | INTRAVENOUS | Status: AC
  Administered 2021-11-15 (×6): 10 meq via INTRAVENOUS
  Filled 2021-11-15 (×5): qty 100

## 2021-11-15 NOTE — Progress Notes (Signed)
Mobility Specialist - Progress Note ? ? ? 11/15/21 1312  ?Mobility  ?Activity Ambulated independently in hallway  ?Level of Assistance Independent  ?Assistive Device Other (Comment) ?(IV Pole)  ?Distance Ambulated (ft) 400 ft  ?Activity Response Tolerated well  ?$Mobility charge 1 Mobility  ? ?Pt agreeable to ambulate this afternoon. NGT clamped by RN prior to mobility specialist arriving. Pt pushed IV pole while ambulating 400 ft. No complaints made during session. Pt returned to recliner after mobilizing and was left with call bell at side and family in room.  ? ?Lindsay Eshleman S. ?Mobility Specialist ?Acute Rehabilitation Services ?Phone: [phone] ?11/15/21, 1:17 PM ? ? ? ? ?

## 2021-11-15 NOTE — Progress Notes (Signed)
?  Progress Note ? ? ?Patient: Lindsay Diaz [id number] DOB: 07-25-1971 DOA: 11/10/2021     Hospitalization day: 5 ?DOS: the patient was seen and examined on 11/15/2021 ?  ?Brief hospital course: ?Lindsay Diaz is a 51 y.o. female with a history of ulcerative colitis s/p colostomy, hypertension, depression. Patient presented secondary to abdominal pain, nausea, vomiting and decreased ostomy output. CT scan significant for small bowel obstruction. General surgery consulted. NG tube placed. Bowel rest. Patient without improvement of SBO and requiring surgical management. ?3/7 S/P laparoscopic primary repair of parastomal hernia and lysis of adhesion. ? ?Assessment and Plan: ?* SBO (small bowel obstruction) secondary to parastomal hernia. ?s/p laparoscopic reduction of parastomal hernia and primary repair by Dr. Johney Maine 11/13/21 ?CT abdomen significant for SBO with herniated small bowel.  ?General surgery consulted.  ?NG tube placed and patient made NPO. ?Due to no improvement General surgery recommended laparoscopic repair of the parastomal hernia. ?Tolerated procedure very well. ?Currently passing gas in the colostomy bag. ?Management per surgery.  Still n.p.o. with an NG tube. ? ?Hypokalemia ?K low again. Replacing. Mg stable.  ?Continue LR ? ? ?Morbid obesity with body mass index (BMI) of 40.0 to 49.9 (HCC) ?Body mass index is 41.09 kg/m?Marland Kitchen  ?Placing the pt at higher risk of poor outcomes. ? ?UC (ulcerative colitis) ([place]) ?Patient not currently on therapy. Previously on mesalamine. Has a history of prednisone use in the past. Recent colonoscopy significant for ulcers noted in the rectum ?No further bleeding. ? ?Asymptomatic bacteriuria ?Noted. ? ?HTN (hypertension) ?Uncontrolled  ?Patient is on atenolol as an outpatient which is held on admission secondary to SBO.  ?-IV hydralazine prn SBP >160 ? ?Thrombocytosis-resolved as of 11/11/2021 ?Transient. Resolved. ? ?Subjective: No nausea no vomiting.  Some gas but no BM in the  bag.  No bleeding. ? ?Physical Exam: ?Vitals:  ? 11/14/21 1414 11/14/21 2221 11/15/21 0229 11/15/21 1243  ?BP: 136/74 (!) 152/80 (!) 166/84 (!) 147/83  ?Pulse: 93 83 89 95  ?Resp: 16 16 16 17   ?Temp: 98.8 ?F (37.1 ?C) 98 ?F (36.7 ?C) 98.7 ?F (37.1 ?C) 98.4 ?F (36.9 ?C)  ?TempSrc: Oral  Oral Oral  ?SpO2: 100% 99% 98% 100%  ?Weight:      ?Height:      ? ?General: Appear in mild distress; no visible Abnormal Neck Mass Or lumps, Conjunctiva normal ?Cardiovascular: S1 and S2 Present, no Murmur, ?Respiratory: good respiratory effort, Bilateral Air entry present and CTA, no Crackles, no wheezes ?Abdomen: Bowel Sound present sluggish.  Diffuse tenderness. ?Extremities: no Pedal edema ?Neurology: alert and  ?Gait not checked due to patient safety concerns  ? ?Data Reviewed: ? I have Reviewed nursing notes, Vitals, and Lab results since pt's last encounter. Pertinent lab results CBC and BMP and magnesium ?I have ordered test including CBC and BMP and magnesium ?I have reviewed the last note from general surgery,   ? ?Family Communication: Family at bedside ? ?Disposition: ?Status is: Inpatient ?Remains inpatient appropriate because: Still n.p.o. requiring NG tube. ? ?Author: ?Berle Mull, MD ?11/15/2021 8:41 PM ? ?For on call review [email or web address] ?

## 2021-11-15 NOTE — Progress Notes (Signed)
? ? ? ? 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 ?Subjective: ?Patient continues to feel better.  Less output in colostomy as day progressed yesterday.  2L of NGT output, but ate a ton of ice.  Ambulating well.  Pain controlled.  Voiding well. ? ?Objective: ?Vital signs in last 24 hours: ?Temp:  [98 ?F (36.7 ?C)-98.8 ?F (37.1 ?C)] 98.7 ?F (37.1 ?C) (03/09 0229) ?Pulse Rate:  [82-93] 89 (03/09 0229) ?Resp:  [16] 16 (03/09 0229) ?BP: (136-166)/(74-84) 166/84 (03/09 0229) ?SpO2:  [98 %-100 %] 98 % (03/09 0229) ?Last BM Date : 11/14/21 ? ?Intake/Output from previous day: ?03/08 0701 - 03/09 0700 ?In: 1295.6 [I.V.:895.6; IV Piggyback:400] ?Out: 3400 [Urine:1350; Emesis/NG output:2000; Stool:50] ?Intake/Output this shift: ?Total I/O ?In: -  ?Out: 350 [Emesis/NG output:350] ? ?Physical Exam: ?Abdomen - soft, incisions c/d/I.  Hernia no longer present.  Colostomy present and currently with just sweat.  NGT clamped with ambulation in room.  [place] just changed.  Great BS ? ? ?Lab Results:  ?Recent Labs  ?  11/14/21 ?0425 11/15/21 ?0420  ?WBC 8.9 7.0  ?HGB 12.2 12.2  ?HCT 39.4 38.5  ?PLT 297 276  ? ?BMET ?Recent Labs  ?  11/14/21 ?0425 11/15/21 ?0420  ?NA 140 136  ?K 3.4* 3.3*  ?CL 106 105  ?CO2 26 24  ?GLUCOSE 102* 80  ?BUN 14 11  ?CREATININE 0.77 0.57  ?CALCIUM 8.5* 8.4*  ? ?PT/INR ?No results for input(s): LABPROT, INR in the last 72 hours. ?Comprehensive Metabolic Panel: ?   ?Component Value Date/Time  ? NA 136 11/15/2021 0420  ? NA 140 11/14/2021 0425  ? NA 138 05/20/2019 1527  ? K 3.3 (L) 11/15/2021 0420  ? K 3.4 (L) 11/14/2021 0425  ? CL 105 11/15/2021 0420  ? CL 106 11/14/2021 0425  ? CO2 24 11/15/2021 0420  ? CO2 26 11/14/2021 0425  ? BUN 11 11/15/2021 0420  ? BUN 14 11/14/2021 0425  ? BUN 9 05/20/2019 1527  ? CREATININE 0.57 11/15/2021 0420  ? CREATININE 0.77 11/14/2021 0425  ? GLUCOSE 80 11/15/2021 0420  ? GLUCOSE 102 (H) 11/14/2021 0425  ? CALCIUM 8.4 (L) 11/15/2021 0420  ? CALCIUM  8.5 (L) 11/14/2021 0425  ? AST 24 11/10/2021 1454  ? AST 23 03/18/2021 1950  ? ALT 20 11/10/2021 1454  ? ALT 22 03/18/2021 1950  ? ALKPHOS 71 11/10/2021 1454  ? ALKPHOS 65 03/18/2021 1950  ? BILITOT 0.4 11/10/2021 1454  ? BILITOT 0.5 03/18/2021 1950  ? BILITOT <0.2 05/20/2019 1527  ? PROT 8.2 (H) 11/10/2021 1454  ? PROT 7.4 03/18/2021 1950  ? PROT 6.7 05/20/2019 1527  ? ALBUMIN 4.2 11/10/2021 1454  ? ALBUMIN 4.0 03/18/2021 1950  ? ALBUMIN 3.9 05/20/2019 1527  ? ? ?Studies/Results: ?DG Abd Portable 1V ? ?Result Date: 11/13/2021 ?CLINICAL DATA:  NG placement EXAM: PORTABLE ABDOMEN - 1 VIEW COMPARISON:  None. FINDINGS: Enteric tube passes into the stomach. Bowel gas pattern is not well evaluated. IMPRESSION: Enteric tube passes into the stomach. Electronically Signed   By: Guadlupe Spanish M.D.   On: 11/13/2021 18:57   ? ?Assessment & Plan: ?POD 2, s/p laparoscopic reduction of parastomal hernia and primary repair by Dr. Michaell Cowing 11/13/21 for SBO secondary to parastomal hernia ?-cont NGT, but clamp today despite no colostomy output as she has great BS and most of her NGT output was ice.  Return to [facility] if develops N/V ?-replace K today to help with gut function, likely  loss from GI, already ordered per medicine ?-mobilize/pulm toilet ?-no abx needed ? ?FEN: NPO/NGT clamped/IVFs ?VTE: lovenox ?ID: Ancef on call to OR only ? ?Ulcerative colitis - some BRBPR 2 nights ago, but no further, anusol per medicine.  Hgb stable ? ? ?Letha Cape ?11/15/2021 ? ? Patient ID: Willer Osorno, female   DOB: 01/10/1971, 51 y.o.   MRN: [id number] ? ?

## 2021-11-16 LAB — BASIC METABOLIC PANEL
Anion gap: 8 (ref 5–15)
BUN: 10 mg/dL (ref 6–20)
CO2: 23 mmol/L (ref 22–32)
Calcium: 8.1 mg/dL — ABNORMAL LOW (ref 8.9–10.3)
Chloride: 104 mmol/L (ref 98–111)
Creatinine, Ser: 0.58 mg/dL (ref 0.44–1.00)
GFR, Estimated: >60 mL/min (ref >60)
Glucose, Bld: 73 mg/dL (ref 70–99)
Potassium: 3.8 mmol/L (ref 3.5–5.1)
Sodium: 135 mmol/L (ref 135–145)

## 2021-11-16 LAB — MAGNESIUM: Magnesium: 1.9 mg/dL (ref 1.7–2.4)

## 2021-11-16 MED ORDER — OXYCODONE HCL 5 MG PO TABS: 5.0000 mg | ORAL_TABLET | Freq: Four times a day (QID) | ORAL | 0 refills | Status: DC | PRN

## 2021-11-16 MED ORDER — OXYCODONE HCL 5 MG PO TABS
5.0000 mg | ORAL_TABLET | ORAL | Status: DC | PRN
  Administered 2021-11-16: 10 mg via ORAL
  Filled 2021-11-16: qty 2

## 2021-11-16 MED ORDER — DIPHENHYDRAMINE HCL 25 MG PO CAPS
25.0000 mg | ORAL_CAPSULE | Freq: Four times a day (QID) | ORAL | Status: DC | PRN
  Administered 2021-11-16: 25 mg via ORAL
  Filled 2021-11-16: qty 1

## 2021-11-16 MED ORDER — ACETAMINOPHEN 500 MG PO TABS
1000.0000 mg | ORAL_TABLET | Freq: Four times a day (QID) | ORAL | 0 refills | Status: AC | PRN
Start: 2021-11-16 — End: ?

## 2021-11-16 MED ORDER — METHOCARBAMOL 500 MG PO TABS: 1000.0000 mg | ORAL_TABLET | Freq: Three times a day (TID) | ORAL | Status: DC | PRN

## 2021-11-16 MED ORDER — ACETAMINOPHEN 500 MG PO TABS
1000.0000 mg | ORAL_TABLET | Freq: Four times a day (QID) | ORAL | Status: DC
  Administered 2021-11-16 – 2021-11-17 (×4): 1000 mg via ORAL
  Filled 2021-11-16 (×4): qty 2

## 2021-11-16 MED ORDER — PANTOPRAZOLE SODIUM 40 MG PO TBEC
40.0000 mg | DELAYED_RELEASE_TABLET | Freq: Every day | ORAL | Status: DC
Start: 2021-11-17 — End: 2021-11-17
  Administered 2021-11-17: 40 mg via ORAL
  Filled 2021-11-16: qty 1

## 2021-11-16 MED ORDER — METHOCARBAMOL 500 MG PO TABS: 500.0000 mg | ORAL_TABLET | Freq: Three times a day (TID) | ORAL | 0 refills | Status: AC | PRN

## 2021-11-16 MED ORDER — MORPHINE SULFATE (PF) 2 MG/ML IV SOLN: 1.0000 mg | INTRAVENOUS | Status: DC | PRN

## 2021-11-16 NOTE — Progress Notes (Signed)
? ? ? ? ?  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 ?Subjective: ?Having great colostomy output.  Eating ice and liquids well with NGT clamped for 24 hours with no issues.  Mobilizing well. ? ?Objective: ?Vital signs in last 24 hours: ?Temp:  [97.8 ?F (36.6 ?C)-98.6 ?F (37 ?C)] 98.6 ?F (37 ?C) (03/10 0514) ?Pulse Rate:  [95-102] 102 (03/10 0514) ?Resp:  [17-18] 18 (03/10 0514) ?BP: (136-155)/(78-94) 136/78 (03/10 0514) ?SpO2:  [97 %-100 %] 97 % (03/10 0514) ?Last BM Date : 11/16/21 ? ?Intake/Output from previous day: ?03/09 0701 - 03/10 0700 ?In: 2539.1 [P.O.:150; I.V.:1789.2; IV Piggyback:600] ?Out: 550 [Urine:200; Emesis/NG output:350] ?Intake/Output this shift: ?Total I/O ?In: -  ?Out: 260 [Urine:200; Stool:60] ? ?Physical Exam: ?Abdomen - soft, incisions c/d/I.  Hernia no longer present.  Colostomy present with a good amount of feculent output.   ? ? ?Lab Results:  ?Recent Labs  ?  11/14/21 ?0425 11/15/21 ?0420  ?WBC 8.9 7.0  ?HGB 12.2 12.2  ?HCT 39.4 38.5  ?PLT 297 276  ? ?BMET ?Recent Labs  ?  11/15/21 ?0420 11/16/21 ?0421  ?NA 136 135  ?K 3.3* 3.8  ?CL 105 104  ?CO2 24 23  ?GLUCOSE 80 73  ?BUN 11 10  ?CREATININE 0.57 0.58  ?CALCIUM 8.4* 8.1*  ? ?PT/INR ?No results for input(s): LABPROT, INR in the last 72 hours. ?Comprehensive Metabolic Panel: ?   ?Component Value Date/Time  ? NA 135 11/16/2021 0421  ? NA 136 11/15/2021 0420  ? NA 138 05/20/2019 1527  ? K 3.8 11/16/2021 0421  ? K 3.3 (L) 11/15/2021 0420  ? CL 104 11/16/2021 0421  ? CL 105 11/15/2021 0420  ? CO2 23 11/16/2021 0421  ? CO2 24 11/15/2021 0420  ? BUN 10 11/16/2021 0421  ? BUN 11 11/15/2021 0420  ? BUN 9 05/20/2019 1527  ? CREATININE 0.58 11/16/2021 0421  ? CREATININE 0.57 11/15/2021 0420  ? GLUCOSE 73 11/16/2021 0421  ? GLUCOSE 80 11/15/2021 0420  ? CALCIUM 8.1 (L) 11/16/2021 0421  ? CALCIUM 8.4 (L) 11/15/2021 0420  ? AST 24 11/10/2021 1454  ? AST 23 03/18/2021 1950  ? ALT 20 11/10/2021 1454  ? ALT 22 03/18/2021 1950  ?  ALKPHOS 71 11/10/2021 1454  ? ALKPHOS 65 03/18/2021 1950  ? BILITOT 0.4 11/10/2021 1454  ? BILITOT 0.5 03/18/2021 1950  ? BILITOT <0.2 05/20/2019 1527  ? PROT 8.2 (H) 11/10/2021 1454  ? PROT 7.4 03/18/2021 1950  ? PROT 6.7 05/20/2019 1527  ? ALBUMIN 4.2 11/10/2021 1454  ? ALBUMIN 4.0 03/18/2021 1950  ? ALBUMIN 3.9 05/20/2019 1527  ? ? ?Studies/Results: ?No results found. ? ?Assessment & Plan: ?POD 3, s/p laparoscopic reduction of parastomal hernia and primary repair by Dr. Michaell Cowing 11/13/21 for SBO secondary to parastomal hernia ?-DC NGT ?-FLD, adv to soft diet in am  ?-oral pain meds ?-mobilize/pulm toilet ?-no abx needed ?-suspect if patient tolerates diet today and in am, can be dc home tomorrow.  Will arrange follow up and send in narcotic script for her. ? ?FEN: FLD, ADAT to soft in am ?VTE: lovenox ?ID: Ancef on call to OR only ? ?Ulcerative colitis - some BRBPR 2 nights ago, but no further, anusol per medicine.  Hgb stable ? ? ?Letha Cape ?11/16/2021 ? ? Patient ID: Lindsay Diaz, female   DOB: 1971/02/08, 51 y.o.   MRN: [id number] ? ?

## 2021-11-16 NOTE — Discharge Instructions (Signed)
[facility] ° °Please arrive at least 30 min before your appointment to complete your check in paperwork.  If you are unable to arrive 30 min prior to your appointment time we may have to cancel or reschedule you. °LAPAROSCOPIC SURGERY: POST OP INSTRUCTIONS °Always review your discharge instruction sheet given to you by the facility where your surgery was performed. °IF YOU HAVE DISABILITY OR FAMILY LEAVE FORMS, YOU MUST BRING THEM TO THE OFFICE FOR PROCESSING.   °DO NOT GIVE THEM TO YOUR DOCTOR. ° °PAIN CONTROL ° °First take acetaminophen (Tylenol) AND/or ibuprofen (Advil) to control your pain after surgery.  Follow directions on package.  Taking acetaminophen (Tylenol) and/or ibuprofen (Advil) regularly after surgery will help to control your pain and lower the amount of prescription pain medication you may need.  You should not take more than 4,000 mg (4 grams) of acetaminophen (Tylenol) in 24 hours.  You should not take ibuprofen (Advil), aleve, motrin, naprosyn or other NSAIDS if you have a history of stomach ulcers or chronic kidney disease.  °A prescription for pain medication may be given to you upon discharge.  Take your pain medication as prescribed, if you still have uncontrolled pain after taking acetaminophen (Tylenol) or ibuprofen (Advil). °Use ice packs to help control pain. °If you need a refill on your pain medication, please contact your pharmacy.  They will contact our office to request authorization. Prescriptions will not be filled after 5pm or on week-ends. ° °HOME MEDICATIONS °Take your usually prescribed medications unless otherwise directed. ° °DIET °You should follow a light diet the first few days after arrival home.  Be sure to include lots of fluids daily. Avoid fatty, fried foods.  ° °CONSTIPATION °It is common to experience some constipation after surgery and if you are taking pain medication.  Increasing fluid intake and taking a stool softener (such as Colace)  will usually help or prevent this problem from occurring.  A mild laxative (Milk of Magnesia or Miralax) should be taken according to package instructions if there are no bowel movements after 48 hours. ° °WOUND/INCISION CARE °Most patients will experience some swelling and bruising in the area of the incisions.  Ice packs will help.  Swelling and bruising can take several days to resolve.  °Unless discharge instructions indicate otherwise, follow guidelines below  °STERI-STRIPS - you may remove your outer bandages 48 hours after surgery, and you may shower at that time.  You have steri-strips (small skin tapes) in place directly over the incision.  These strips should be left on the skin for 7-10 days.   °DERMABOND/SKIN GLUE - you may shower in 24 hours.  The glue will flake off over the next 2-3 weeks. °Any sutures or staples will be removed at the office during your follow-up visit. ° °ACTIVITIES °You may resume regular (light) daily activities beginning the next day--such as daily self-care, walking, climbing stairs--gradually increasing activities as tolerated.  You may have sexual intercourse when it is comfortable.  Refrain from any heavy lifting or straining until approved by your doctor. °You may drive when you are no longer taking prescription pain medication, you can comfortably wear a seatbelt, and you can safely maneuver your car and apply brakes. ° °FOLLOW-UP °You should see your doctor in the office for a follow-up appointment approximately 2-3 weeks after your surgery.  You should have been given your post-op/follow-up appointment when your surgery was scheduled.  If you did not receive a post-op/follow-up appointment, make sure   that you call for this appointment within a day or two after you arrive home to insure a convenient appointment time. ° ° °WHEN TO CALL YOUR DOCTOR: °Fever over 101.0 °Inability to urinate °Continued bleeding from incision. °Increased pain, redness, or drainage from the  incision. °Increasing abdominal pain ° °The clinic staff is available to answer your questions during regular business hours.  Please don’t hesitate to call and ask to speak to one of the nurses for clinical concerns.  If you have a medical emergency, go to the nearest emergency room or call 911.  A surgeon from [facility] is always on call at the hospital. °[address], [address], [place], [place]  [address] ? [address] [address], , [place]   [address] °[phone] ? [phone] ? FAX [phone] ° ° ° ° °Managing Your Pain After Surgery Without Opioids ° ° ° °Thank you for participating in our program to help patients manage their pain after surgery without opioids. This is part of our effort to provide you with the best care possible, without exposing you or your family to the risk that opioids pose. ° °What pain can I expect after surgery? °You can expect to have some pain after surgery. This is normal. The pain is typically worse the day after surgery, and quickly begins to get better. °Many studies have found that many patients are able to manage their pain after surgery with Over-the-Counter (OTC) medications such as Tylenol and Motrin. If you have a condition that does not allow you to take Tylenol or Motrin, notify your surgical team. ° °How will I manage my pain? °The best strategy for controlling your pain after surgery is around the clock pain control with Tylenol (acetaminophen) and Motrin (ibuprofen or Advil). Alternating these medications with each other allows you to maximize your pain control. In addition to Tylenol and Motrin, you can use heating pads or ice packs on your incisions to help reduce your pain. ° °How will I alternate your regular strength over-the-counter pain medication? °You will take a dose of pain medication every three hours. °Start by taking 650 mg of Tylenol (2 pills of 325 mg) °3 hours later take 600 mg of Motrin (3 pills of 200 mg) °3 hours after  taking the Motrin take 650 mg of Tylenol °3 hours after that take 600 mg of Motrin. ° ° °- 1 - ° °See example - if your first dose of Tylenol is at 12:00 PM ° ° °12:00 PM Tylenol 650 mg (2 pills of 325 mg)  °3:00 PM Motrin 600 mg (3 pills of 200 mg)  °6:00 PM Tylenol 650 mg (2 pills of 325 mg)  °9:00 PM Motrin 600 mg (3 pills of 200 mg)  °Continue alternating every 3 hours  ° °We recommend that you follow this schedule around-the-clock for at least 3 days after surgery, or until you feel that it is no longer needed. Use the table on the last page of this handout to keep track of the medications you are taking. °Important: °Do not take more than 3000mg of Tylenol or 3200mg of Motrin in a 24-hour period. °Do not take ibuprofen/Motrin if you have a history of bleeding stomach ulcers, severe kidney disease, &/or actively taking a blood thinner ° °What if I still have pain? °If you have pain that is not controlled with the over-the-counter pain medications (Tylenol and Motrin or Advil) you might have what we call “breakthrough” pain. You will receive a prescription   for a small amount of an opioid pain medication such as Oxycodone, Tramadol, or Tylenol with Codeine. Use these opioid pills in the first 24 hours after surgery if you have breakthrough pain. Do not take more than 1 pill every 4-6 hours. ° °If you still have uncontrolled pain after using all opioid pills, don't hesitate to call our staff using the number provided. We will help make sure you are managing your pain in the best way possible, and if necessary, we can provide a prescription for additional pain medication. ° ° °Day 1   ° °Time  °Name of Medication Number of pills taken  °Amount of Acetaminophen  °Pain Level  ° °Comments  °AM PM       °AM PM       °AM PM       °AM PM       °AM PM       °AM PM       °AM PM       °AM PM       °Total Daily amount of Acetaminophen °Do not take more than  3,000 mg per day    ° ° °Day 2   ° °Time  °Name of Medication  Number of pills °taken  °Amount of Acetaminophen  °Pain Level  ° °Comments  °AM PM       °AM PM       °AM PM       °AM PM       °AM PM       °AM PM       °AM PM       °AM PM       °Total Daily amount of Acetaminophen °Do not take more than  3,000 mg per day    ° ° °Day 3   ° °Time  °Name of Medication Number of pills taken  °Amount of Acetaminophen  °Pain Level  ° °Comments  °AM PM       °AM PM       °AM PM       °AM PM       ° ° ° °AM PM       °AM PM       °AM PM       °AM PM       °Total Daily amount of Acetaminophen °Do not take more than  3,000 mg per day    ° ° °Day 4   ° °Time  °Name of Medication Number of pills taken  °Amount of Acetaminophen  °Pain Level  ° °Comments  °AM PM       °AM PM       °AM PM       °AM PM       °AM PM       °AM PM       °AM PM       °AM PM       °Total Daily amount of Acetaminophen °Do not take more than  3,000 mg per day    ° ° °Day 5   ° °Time  °Name of Medication Number °of pills taken  °Amount of Acetaminophen  °Pain Level  ° °Comments  °AM PM       °AM PM       °AM PM       °AM PM       °AM PM       °AM   PM       °AM PM       °AM PM       °Total Daily amount of Acetaminophen °Do not take more than  3,000 mg per day    ° ° ° °Day 6   ° °Time  °Name of Medication Number of pills °taken  °Amount of Acetaminophen  °Pain Level  °Comments  °AM PM       °AM PM       °AM PM       °AM PM       °AM PM       °AM PM       °AM PM       °AM PM       °Total Daily amount of Acetaminophen °Do not take more than  3,000 mg per day    ° ° °Day 7   ° °Time  °Name of Medication Number of pills taken  °Amount of Acetaminophen  °Pain Level  ° °Comments  °AM PM       °AM PM       °AM PM       °AM PM       °AM PM       °AM PM       °AM PM       °AM PM       °Total Daily amount of Acetaminophen °Do not take more than  3,000 mg per day    ° ° ° ° °For additional information about how and where to safely dispose of unused opioid °medications - https://www.morepowerfulnc.org ° °Disclaimer: This document  contains information and/or instructional materials adapted from [facility] for the typical patient with your condition. It does not replace medical advice from your health care provider because your experience may differ from that of the °typical patient. Talk to your health care provider if you have any questions about this °document, your condition or your treatment plan. °Adapted from [facility] ° °

## 2021-11-16 NOTE — Progress Notes (Signed)
?  Progress Note ? ? ?Patient: Lindsay Diaz [id number] DOB: 05/09/1971 DOA: 11/10/2021     Hospitalization day: 6 ?DOS: the patient was seen and examined on 11/16/2021 ? ?Brief hospital course: ?Lindsay Diaz is a 51 y.o. female with a history of ulcerative colitis s/p colostomy, hypertension, depression. Patient presented secondary to abdominal pain, nausea, vomiting and decreased ostomy output. CT scan significant for small bowel obstruction. General surgery consulted. NG tube placed. Bowel rest. Patient without improvement of SBO and requiring surgical management. ?3/7 S/P laparoscopic primary repair of parastomal hernia and lysis of adhesion. ?3/10 NG removed and diet advanced ? ?Assessment and Plan: ?* SBO (small bowel obstruction) secondary to parastomal hernia. ?s/p laparoscopic reduction of parastomal hernia and primary repair by Dr. Johney Maine 11/13/21 ?CT abdomen significant for SBO with herniated small bowel.  ?General surgery consulted.  ?NG tube placed and patient made NPO. ?Due to no improvement General surgery recommended laparoscopic repair of the parastomal hernia. ?Tolerated procedure very well. ?Currently passing gas in the colostomy bag. ?Management per surgery. Plan is to advance diet and if tolerates soft diet tomorrow, pt will be able to go home. ? ?Hypokalemia ?Now replaced. Monitor. ? ?Morbid obesity with body mass index (BMI) of 40.0 to 49.9 (HCC) ?Body mass index is 41.09 kg/m?Marland Kitchen  ?Placing the pt at higher risk of poor outcomes. ? ?UC (ulcerative colitis) ([place]) ?Patient not currently on therapy. Previously on mesalamine. Has a history of prednisone use in the past. Recent colonoscopy significant for ulcers noted in the rectum ?No further bleeding. ? ?Asymptomatic bacteriuria ?Noted. ? ?HTN (hypertension) ?Uncontrolled  ?Patient is on atenolol as an outpatient which is held on admission secondary to SBO.  ?-IV hydralazine prn SBP >160 ? ?Thrombocytosis-resolved as of 11/11/2021 ?Transient.  Resolved. ? ?Subjective: no acute complains, tolerating oral diet, gas in colostomy ? ?Physical Exam: ?Vitals:  ? 11/15/21 1243 11/15/21 2139 11/16/21 0514 11/16/21 1339  ?BP: (!) 147/83 (!) 155/94 136/78 (!) 125/100  ?Pulse: 95 95 (!) 102 97  ?Resp: 17 18 18 17   ?Temp: 98.4 ?F (36.9 ?C) 97.8 ?F (36.6 ?C) 98.6 ?F (37 ?C) 97.7 ?F (36.5 ?C)  ?TempSrc: Oral Oral Oral Oral  ?SpO2: 100% 100% 97% 100%  ?Weight:      ?Height:      ? ?General: Appear in mild distress; no visible Abnormal Neck Mass Or lumps, Conjunctiva normal ?Cardiovascular: S1 and S2 Present, no Murmur, ?Respiratory: good respiratory effort, Bilateral Air entry present and CTA, no Crackles, no wheezes ?Abdomen: Bowel Sound present, gas in colostomy bag ?Extremities: no Pedal edema ?Neurology: alert and oriented to time, place, and person ?Gait not checked due to patient safety concerns  ? ?Data Reviewed: ?I have Reviewed nursing notes, Vitals, and Lab results since pt's last encounter. Pertinent lab results cbc bmp ?I have ordered test including cbc bmp ?I have discussed pt's care plan and test results with General surgery.  ? ?Family Communication: son at bedside ? ?Disposition: ?Status is: Inpatient ?Remains inpatient appropriate because: overnight observation to ensure that the pt is able to,tolerate diet advancement rapid advancement will risk reoccurrence of symptoms.  ? ?Author: ?Berle Mull, MD ?11/16/2021 4:43 PM ? ?For on call review [email or web address]. ?

## 2021-11-16 NOTE — Progress Notes (Addendum)
The patient is receiving Protonix by the intravenous route.  Based on criteria approved by the Pharmacy and Therapeutics Committee and the Medical Executive Committee, the medication is being converted to the equivalent oral dose form. ? ?These criteria include: ?-No active GI bleeding ?-Able to tolerate diet of full liquids (or better) or tube feeding ?-Able to tolerate other medications by the oral or enteral route ? ?If you have any questions about this conversion, please contact the Pharmacy Department (phone [phone]).  Thank you.  ? ? ?PRN IV benadryl changed to PRN PO benadryl  ? ?

## 2021-11-17 LAB — BASIC METABOLIC PANEL
Anion gap: 7 (ref 5–15)
BUN: 6 mg/dL (ref 6–20)
CO2: 26 mmol/L (ref 22–32)
Calcium: 8.5 mg/dL — ABNORMAL LOW (ref 8.9–10.3)
Chloride: 104 mmol/L (ref 98–111)
Creatinine, Ser: 0.54 mg/dL (ref 0.44–1.00)
GFR, Estimated: >60 mL/min (ref >60)
Glucose, Bld: 100 mg/dL — ABNORMAL HIGH (ref 70–99)
Potassium: 3.7 mmol/L (ref 3.5–5.1)
Sodium: 137 mmol/L (ref 135–145)

## 2021-11-17 LAB — MAGNESIUM: Magnesium: 1.9 mg/dL (ref 1.7–2.4)

## 2021-11-17 MED ORDER — PANTOPRAZOLE SODIUM 40 MG PO TBEC: 40.0000 mg | DELAYED_RELEASE_TABLET | Freq: Every day | ORAL | 0 refills | Status: DC

## 2021-11-17 NOTE — Progress Notes (Signed)
? ? ? ? ?  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 ?Subjective: ?Having great colostomy output.  Tolerating a diet ? ?Objective: ?Vital signs in last 24 hours: ?Temp:  [97.7 ?F (36.5 ?C)-98.3 ?F (36.8 ?C)] 98 ?F (36.7 ?C) (03/11 0509) ?Pulse Rate:  [85-100] 85 (03/11 0509) ?Resp:  [17-18] 18 (03/11 0509) ?BP: (125-147)/(76-100) 133/76 (03/11 0509) ?SpO2:  [92 %-100 %] 100 % (03/11 0509) ?Last BM Date : 11/16/21 ? ?Intake/Output from previous day: ?03/10 0701 - 03/11 0700 ?In: 904.9 [P.O.:240; I.V.:664.9] ?Out: [id number] [Urine:3500; Stool:935] ?Intake/Output this shift: ?No intake/output data recorded. ? ?Physical Exam: ?Abdomen - soft, incisions c/d/I.  Hernia no longer present.  Colostomy present with a good amount of feculent output.   ? ? ?Lab Results:  ?Recent Labs  ?  11/15/21 ?0420  ?WBC 7.0  ?HGB 12.2  ?HCT 38.5  ?PLT 276  ? ? ?BMET ?Recent Labs  ?  11/16/21 ?0421 11/17/21 ?[id number]  ?NA 135 137  ?K 3.8 3.7  ?CL 104 104  ?CO2 23 26  ?GLUCOSE 73 100*  ?BUN 10 6  ?CREATININE 0.58 0.54  ?CALCIUM 8.1* 8.5*  ? ? ?PT/INR ?No results for input(s): LABPROT, INR in the last 72 hours. ?Comprehensive Metabolic Panel: ?   ?Component Value Date/Time  ? NA 137 11/17/2021 0516  ? NA 135 11/16/2021 0421  ? NA 138 05/20/2019 1527  ? K 3.7 11/17/2021 0516  ? K 3.8 11/16/2021 0421  ? CL 104 11/17/2021 0516  ? CL 104 11/16/2021 0421  ? CO2 26 11/17/2021 0516  ? CO2 23 11/16/2021 0421  ? BUN 6 11/17/2021 0516  ? BUN 10 11/16/2021 0421  ? BUN 9 05/20/2019 1527  ? CREATININE 0.54 11/17/2021 0516  ? CREATININE 0.58 11/16/2021 0421  ? GLUCOSE 100 (H) 11/17/2021 0516  ? GLUCOSE 73 11/16/2021 0421  ? CALCIUM 8.5 (L) 11/17/2021 0516  ? CALCIUM 8.1 (L) 11/16/2021 0421  ? AST 24 11/10/2021 1454  ? AST 23 03/18/2021 1950  ? ALT 20 11/10/2021 1454  ? ALT 22 03/18/2021 1950  ? ALKPHOS 71 11/10/2021 1454  ? ALKPHOS 65 03/18/2021 1950  ? BILITOT 0.4 11/10/2021 1454  ? BILITOT 0.5 03/18/2021 1950  ? BILITOT <0.2 05/20/2019  1527  ? PROT 8.2 (H) 11/10/2021 1454  ? PROT 7.4 03/18/2021 1950  ? PROT 6.7 05/20/2019 1527  ? ALBUMIN 4.2 11/10/2021 1454  ? ALBUMIN 4.0 03/18/2021 1950  ? ALBUMIN 3.9 05/20/2019 1527  ? ? ?Studies/Results: ?No results found. ? ?Assessment & Plan: ?POD 4, s/p laparoscopic reduction of parastomal hernia and primary repair by Dr. Johney Maine 11/13/21 for SBO secondary to parastomal hernia ?-soft diet ?-oral pain meds ?-mobilize/pulm toilet ?-no abx needed ?-ok to dc home.  narcotic script sent to pharmacy. ? ?FEN: reg ?VTE: lovenox ?ID: Ancef on call to OR only ? ?Ulcerative colitis - per GI, home meds ? ? ?Rosario Adie ?11/17/2021 ? ? Patient ID: Lindsay Diaz, female   DOB: [birth year], 51 y.o.   MRN: [id number] ? ?

## 2021-11-17 NOTE — Progress Notes (Signed)
Assessment unchanged. Pt verbalized understanding of dc instructions through teach back including mediations. Discharged via foot per request to front entrance accompanied by NT. ?

## 2021-11-18 NOTE — Discharge Summary (Signed)
Physician Discharge Summary   Patient: Lindsay Diaz MRN: [id number] DOB: Oct 07, 1970  Admit date:     11/10/2021  Discharge date: 11/17/2021  Discharge Physician: Lynden Oxford  PCP: [facility]  Recommendations at discharge: Follow-up with surgery as recommended.  Discharge Diagnoses: Principal Problem:   SBO (small bowel obstruction) secondary to parastomal hernia. 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 female with a history of ulcerative colitis s/p colostomy, hypertension, depression. Patient presented secondary to abdominal pain, nausea, vomiting and decreased ostomy output. CT scan significant for small bowel obstruction. General surgery consulted. NG tube placed. Bowel rest. Patient without improvement of SBO and requiring surgical management. 3/7 S/P laparoscopic primary repair of parastomal hernia and lysis of adhesion. 3/10 NG removed and diet advanced  Assessment and Plan: * SBO (small bowel obstruction) secondary to parastomal hernia. 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 Michaell Cowing 11/13/21 CT abdomen significant for SBO with herniated small bowel.  General surgery consulted.  NG tube placed and patient made NPO. Due to no improvement General surgery recommended laparoscopic repair of the parastomal hernia. Tolerated procedure very well.  Now tolerating diet.  Hypokalemia Now replaced.  Morbid obesity with body mass index (BMI) of 40.0 to 49.9 (HCC) Body mass index is 41.09 kg/m. Placing the pt at higher risk of poor outcomes.  UC (ulcerative colitis) (HCC) Patient not currently on therapy.  Previously on mesalamine. Has a history of prednisone use in the past. Recent colonoscopy significant for ulcers noted in the rectum No further bleeding.  Asymptomatic bacteriuria Noted.  HTN (hypertension) Uncontrolled  Patient is on atenolol as an outpatient which is held on admission secondary to SBO.   Thrombocytosis-resolved as of 11/11/2021 Transient. Resolved.  Pain control - [employer] Controlled Substance Reporting System database was reviewed. and patient was instructed, not to drive, operate heavy machinery, perform activities at heights, swimming or participation in water activities or provide baby-sitting services while on Pain, Sleep and Anxiety Medications; until their outpatient Physician has advised to do so again. Also recommended to not to take more than prescribed Pain, Sleep and Anxiety Medications.   Consultants: General surgery  Procedures performed:  LAPAROSCOPIC PRIMARY REPAIR OF PARASTOMAL HERNIA LAPAROSCOPIC LYSIS OF ADHESIONS X 75 MIN (66% CASE) TAP BLOCK - LEFT  DISCHARGE MEDICATION: Allergies as of 11/17/2021       Reactions   Zofran [ondansetron] Nausea And Vomiting        Medication List     STOP taking these medications    mesalamine 1.2 g EC tablet Commonly known as: LIALDA   TYLENOL 8 HOUR PO Replaced by: acetaminophen 500 MG tablet       TAKE these medications    acetaminophen 500 MG tablet Commonly known as: TYLENOL Take 2 tablets (1,000 mg total) by mouth every 6 (six) hours as needed. Replaces: TYLENOL 8 HOUR PO   atenolol 50 MG tablet Commonly known as: TENORMIN Take 50 mg by mouth at bedtime.   buPROPion 150 MG 12 hr tablet Commonly known as: WELLBUTRIN SR Take 150 mg by mouth daily.   citalopram 40 MG tablet Commonly known as: CELEXA Take 40  mg by mouth daily.   methocarbamol 500 MG tablet Commonly known as: ROBAXIN Take 1-2 tablets (500-1,000 mg total) by mouth every 8 (eight) hours as needed for muscle  spasms.   oxyCODONE 5 MG immediate release tablet Commonly known as: Oxy IR/ROXICODONE Take 1 tablet (5 mg total) by mouth every 6 (six) hours as needed for moderate pain. What changed:  when to take this reasons to take this   pantoprazole 40 MG tablet Commonly known as: PROTONIX Take 1 tablet (40 mg total) by mouth daily.   Turmeric 500 MG Caps Take 500 mg by mouth at bedtime.   Vitamin D-3 125 MCG (5000 UT) Tabs Take 5,000 Units by mouth at bedtime.   zolpidem 10 MG tablet Commonly known as: AMBIEN Take 10 mg by mouth at bedtime.        Follow-up Information     Karie Soda, MD Follow up on 12/03/2021.   Specialties: General Surgery, Colon and Rectal Surgery Why: 3:00pm. Please arrive 30 minutes prior to your appointment time for paperwork and check in process.  please bring photo ID and insurance card. Contact information: [address] [place] [address] [phone]         [facility]. Schedule an appointment as soon as possible for a visit in 1 week(s).   Specialty: Family Medicine Contact information: [address] [place] [address] [phone]                Disposition: Home Diet recommendation:    Start     Ordered   11/17/21 0000  Diet - low sodium heart healthy        11/17/21 0732    Discharge Exam: Filed Weights   11/10/21 1445 11/12/21 0640  Weight: 127 kg 119 kg   General: Appear in mild distress; no visible Abnormal Neck Mass Or lumps, Conjunctiva normal Cardiovascular: S1 and S2 Present, no Murmur, Respiratory: good respiratory effort, Bilateral Air entry present and CTA, no Crackles, no wheezes Abdomen: Bowel Sound present nontender Extremities: no Pedal edema Neurology: alert and oriented to time, place, and person Gait not checked due to patient safety concerns   Condition at discharge: good  The results of significant diagnostics from this  hospitalization (including imaging, microbiology, ancillary and laboratory) are listed below for reference.   Imaging Studies: DG Abd 1 View  Result Date: 11/11/2021 CLINICAL DATA:  NG tube placement. EXAM: ABDOMEN - 1 VIEW COMPARISON:  11/11/2021. FINDINGS: There is mildly distended small bowel measuring up to 3.9 cm in diameter with retained contrast in the mid left abdomen. An enteric tube terminates in the stomach. A cluster of calcifications are present in the right upper quadrant suggesting cholelithiasis. IMPRESSION: 1. Enteric tube terminates in the stomach. 2. Mildly distended small bowel measuring up to 3.9 cm in diameter. 3. Cholelithiasis. Electronically Signed   By: Thornell Sartorius M.D.   On: 11/11/2021 20:23   DG Abdomen 1 View  Result Date: 11/10/2021 CLINICAL DATA:  NG tube placement. EXAM: ABDOMEN - 1 VIEW COMPARISON:  None. FINDINGS: NG tube with distal tip below the GE junction. Side port is at the level of the GE junction. Lung bases are clear. Paucity of bowel gas. IMPRESSION: 1. NG tube with distal tip below the GE junction but side port is at the level of GE junction, it could be advanced 4-5 cm. 2.  Lung bases are clear.  Paucity of bowel gas. Electronically  Signed   By: Larose HiresImran  Ahmed D.O.   On: 11/10/2021 18:02   CT ABDOMEN PELVIS W CONTRAST  Result Date: 11/10/2021 CLINICAL DATA:  Bowel obstruction suspected EXAM: CT ABDOMEN AND PELVIS WITH CONTRAST TECHNIQUE: Multidetector CT imaging of the abdomen and pelvis was performed using the standard protocol following bolus administration of intravenous contrast. RADIATION DOSE REDUCTION: This exam was performed according to the departmental dose-optimization program which includes automated exposure control, adjustment of the mA and/or kV according to patient size and/or use of iterative reconstruction technique. CONTRAST:  100mL OMNIPAQUE IOHEXOL 300 MG/ML  SOLN COMPARISON:  March 18, 2021 FINDINGS: Lower chest: No acute abnormality.  Hepatobiliary: Multiple cholelithiasis in a distended gallbladder without ancillary evidence of acute cholecystitis. Liver is unremarkable. Portal vein is patent. No intrahepatic or extrahepatic biliary ductal dilation. Pancreas: Unremarkable. No pancreatic ductal dilatation or surrounding inflammatory changes. Spleen: Normal in size without focal abnormality. Adrenals/Urinary Tract: Adrenal glands are unremarkable. Kidneys enhance symmetrically. No evidence of hydronephrosis. There are innumerable bilateral nonobstructing nephrolithiasis. Resolution of previously described RIGHT-sided hydronephrosis. Bladder is decompressed. Stomach/Bowel: Status post diverting ostomy in the LEFT hemiabdomen. Ostomy contains several loops of herniated small bowel as well as a small amount of fluid. Multiple of these loops of small bowel are dilated. Representative dilated loop of small bowel measures 3.4 cm. Transition point within the ostomy may be at a site of additional mesenteric defect (series 5, image 87; series 2, image 44;). There are several decompressed loops of bowel within the superior aspect of the ostomy. Colon within the ostomy is decompressed. There is a loop of mildly dilated small bowel within the LEFT lower quadrant which measures 3.0 cm. Small hiatal hernia. Vascular/Lymphatic: No significant vascular findings are present. No enlarged abdominal or pelvic lymph nodes. Reproductive: Uterus and bilateral adnexa are unremarkable. Other: No free air. Musculoskeletal: Degenerative changes of the lumbar spine. IMPRESSION: 1. There is small bowel obstruction due to several loops of herniated small bowel within the LEFT abdominal ostomy. There is a small amount of fluid within the ostomy without definite evidence of ischemia or perforation. Electronically Signed   By: Meda KlinefelterStephanie  Peacock M.D.   On: 11/10/2021 16:37   DG Abd 2 Views  Result Date: 11/12/2021 CLINICAL DATA:  History of ulcerative colitis post colostomy.  Current abdominal pain with nausea and vomiting and decreased ostomy output. EXAM: ABDOMEN - 2 VIEW COMPARISON:  11/11/2021 and CT 11/10/2021 FINDINGS: Nasogastric tube in adequate position over the stomach in the left upper quadrant. Evidence of cholelithiasis. Air and stool present within the colon. 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 No free peritoneal air. Remainder the exam is unchanged. 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 Electronically Signed   By: Elberta Fortisaniel  Boyle M.D.   On: 11/12/2021 09:49   DG Abd Portable 1V  Result Date: 11/13/2021 CLINICAL DATA:  NG placement EXAM: PORTABLE ABDOMEN - 1 VIEW COMPARISON:  None. FINDINGS: Enteric tube passes into the stomach. Bowel gas pattern is not well evaluated. IMPRESSION: Enteric tube passes into the stomach. Electronically Signed   By: Guadlupe SpanishPraneil  Mechele Kittleson M.D.   On: 11/13/2021 18:57   DG Abd  Portable 1V-Small Bowel Obstruction Protocol-initial, 8 hr delay  Result Date: 11/11/2021 CLINICAL DATA:  Ulcer colitis evaluate for obstruction EXAM: PORTABLE ABDOMEN - 1 VIEW COMPARISON:  11/10/2021 FINDINGS: Multiple dilated loops of small bowel in the left side of the abdomen measuring to 4.4 cm likely within the parastomal hernia. The loops of bowel are filled with contrast concerning for small bowel obstruction. No evidence of pneumoperitoneum, portal venous gas or pneumatosis. No pathologic calcifications along the expected course of the ureters. No acute osseous abnormality. IMPRESSION: 1. Multiple dilated loops of small bowel in the left side of the abdomen measuring to 4.4 cm  likely within the parastomal hernia. The loops of bowel are filled with contrast concerning for small bowel obstruction. Electronically Signed   By: Elige Ko M.D.   On: 11/11/2021 10:16    Microbiology: Results for orders placed or performed during the hospital encounter of 11/10/21  Resp Panel by RT-PCR (Flu A&B, Covid) Nasopharyngeal Swab     Status: None   Collection Time: 11/10/21  4:58 PM   Specimen: Nasopharyngeal Swab; Nasopharyngeal(NP) swabs in vial transport medium  Result Value Ref Range Status   SARS Coronavirus 2 by RT PCR NEGATIVE NEGATIVE Final    Comment: (NOTE) SARS-CoV-2 target nucleic acids are NOT DETECTED.  The SARS-CoV-2 RNA is generally detectable in upper respiratory specimens during the acute phase of infection. The lowest concentration of SARS-CoV-2 viral copies this assay can detect is 138 copies/mL. A negative result does not preclude SARS-Cov-2 infection and should not be used as the sole basis for treatment or other patient management decisions. A negative result may occur with  improper specimen collection/handling, submission of specimen other than nasopharyngeal swab, presence of viral mutation(s) within the areas targeted by this assay, and inadequate number of viral copies(<138 copies/mL). A negative result must be combined with clinical observations, patient history, and epidemiological information. The expected result is Negative.  Fact Sheet for Patients:  BloggerCourse.com  Fact Sheet for Healthcare Providers:  SeriousBroker.it  This test is no t yet approved or cleared by the [place] FDA and  has been authorized for detection and/or diagnosis of SARS-CoV-2 by FDA under an Emergency Use Authorization (EUA). This EUA will remain  in effect (meaning this test can be used) for the duration of the COVID-19 declaration under Section 564(b)(1) of the Act, 21 U.S.C.section 360bbb-3(b)(1),  unless the authorization is terminated  or revoked sooner.       Influenza A by PCR NEGATIVE NEGATIVE Final   Influenza B by PCR NEGATIVE NEGATIVE Final    Comment: (NOTE) The Xpert Xpress SARS-CoV-2/FLU/RSV plus assay is intended as an aid in the diagnosis of influenza from Nasopharyngeal swab specimens and should not be used as a sole basis for treatment. Nasal washings and aspirates are unacceptable for Xpert Xpress SARS-CoV-2/FLU/RSV testing.  Fact Sheet for Patients: BloggerCourse.com  Fact Sheet for Healthcare Providers: SeriousBroker.it  This test is not yet approved or cleared by the [place] FDA and has been authorized for detection and/or diagnosis of SARS-CoV-2 by FDA under an Emergency Use Authorization (EUA). This EUA will remain in effect (meaning this test can be used) for the duration of the COVID-19 declaration under Section 564(b)(1) of the Act, 21 U.S.C. section 360bbb-3(b)(1), unless the authorization is terminated or revoked.  Performed at [facility], [address] [place], [place] [address]    Labs: CBC: Recent Labs  Lab 11/14/21 0425 11/15/21 0420  WBC 8.9 7.0  HGB 12.2 12.2  HCT 39.4 38.5  MCV 91.6 89.3  PLT 297 276   Basic Metabolic Panel: Recent Labs  Lab 11/13/21 0428 11/14/21 0425 11/15/21 0420 11/16/21 0421 11/17/21 0516  NA 138 140 136 135 137  K 3.6 3.4* 3.3* 3.8 3.7  CL 105 106 105 104 104  CO2 25 26 24 23 26   GLUCOSE 110* 102* 80 73 100*  BUN 18 14 11 10 6   CREATININE 0.70 0.77 0.57 0.58 0.54  CALCIUM 8.7* 8.5* 8.4* 8.1* 8.5*  MG  --  2.2 1.9 1.9 1.9   Liver Function Tests: No results for input(s): AST, ALT, ALKPHOS, BILITOT, PROT, ALBUMIN in the last 168 hours. CBG: No results for input(s): GLUCAP in the last 168 hours.  Discharge time spent: greater than 30 minutes.  Signed: , MD [facility] 11/17/2021

## 2022-01-01 ENCOUNTER — Inpatient Hospital Stay (HOSPITAL_COMMUNITY)
Admission: EM | Admit: 2022-01-01 | Discharge: 2022-01-08 | DRG: 353 | Disposition: A | Discharge status: Home / Self Care | Payer: BC Managed Care – PPO | Attending: Internal Medicine | Admitting: Internal Medicine

## 2022-01-01 ENCOUNTER — Emergency Department (HOSPITAL_COMMUNITY): Payer: BC Managed Care – PPO

## 2022-01-01 ENCOUNTER — Inpatient Hospital Stay (HOSPITAL_COMMUNITY): Payer: BC Managed Care – PPO

## 2022-01-01 ENCOUNTER — Encounter (HOSPITAL_COMMUNITY): Payer: Self-pay

## 2022-01-01 ENCOUNTER — Other Ambulatory Visit: Payer: Self-pay

## 2022-01-01 DIAGNOSIS — I10 Essential (primary) hypertension: Secondary | ICD-10-CM | POA: Diagnosis present

## 2022-01-01 DIAGNOSIS — Z9841 Cataract extraction status, right eye: Secondary | ICD-10-CM | POA: Diagnosis not present

## 2022-01-01 DIAGNOSIS — R000 Tachycardia, unspecified: Secondary | ICD-10-CM | POA: Diagnosis not present

## 2022-01-01 DIAGNOSIS — E872 Acidosis, unspecified: Secondary | ICD-10-CM | POA: Diagnosis not present

## 2022-01-01 DIAGNOSIS — Z888 Allergy status to other drugs, medicaments and biological substances status: Secondary | ICD-10-CM | POA: Diagnosis not present

## 2022-01-01 DIAGNOSIS — Z9049 Acquired absence of other specified parts of digestive tract: Secondary | ICD-10-CM

## 2022-01-01 DIAGNOSIS — E785 Hyperlipidemia, unspecified: Secondary | ICD-10-CM | POA: Diagnosis present

## 2022-01-01 DIAGNOSIS — Z79899 Other long term (current) drug therapy: Secondary | ICD-10-CM | POA: Diagnosis not present

## 2022-01-01 DIAGNOSIS — F32A Depression, unspecified: Secondary | ICD-10-CM | POA: Diagnosis present

## 2022-01-01 DIAGNOSIS — F419 Anxiety disorder, unspecified: Secondary | ICD-10-CM | POA: Diagnosis present

## 2022-01-01 DIAGNOSIS — D72829 Elevated white blood cell count, unspecified: Secondary | ICD-10-CM | POA: Diagnosis present

## 2022-01-01 DIAGNOSIS — E876 Hypokalemia: Secondary | ICD-10-CM | POA: Diagnosis not present

## 2022-01-01 DIAGNOSIS — E860 Dehydration: Secondary | ICD-10-CM | POA: Diagnosis present

## 2022-01-01 DIAGNOSIS — K433 Parastomal hernia with obstruction, without gangrene: Principal | ICD-10-CM | POA: Diagnosis present

## 2022-01-01 DIAGNOSIS — K56609 Unspecified intestinal obstruction, unspecified as to partial versus complete obstruction: Principal | ICD-10-CM | POA: Diagnosis present

## 2022-01-01 DIAGNOSIS — Z933 Colostomy status: Secondary | ICD-10-CM

## 2022-01-01 DIAGNOSIS — Z6841 Body Mass Index (BMI) 40.0 and over, adult: Secondary | ICD-10-CM | POA: Diagnosis not present

## 2022-01-01 DIAGNOSIS — J690 Pneumonitis due to inhalation of food and vomit: Secondary | ICD-10-CM | POA: Diagnosis present

## 2022-01-01 LAB — URINALYSIS, ROUTINE W REFLEX MICROSCOPIC
Bilirubin Urine: NEGATIVE
Glucose, UA: NEGATIVE mg/dL
Ketones, ur: NEGATIVE mg/dL
Leukocytes,Ua: NEGATIVE
Nitrite: NEGATIVE
Protein, ur: 30 mg/dL — AB
Specific Gravity, Urine: <1.005 — ABNORMAL LOW (ref 1.005–1.030)
Squamous Epithelial / HPF: >50 — ABNORMAL HIGH (ref 0–5)
pH: 5 (ref 5.0–8.0)

## 2022-01-01 LAB — CBC
HCT: 43.7 % (ref 36.0–46.0)
Hemoglobin: 14.3 g/dL (ref 12.0–15.0)
MCH: 28.3 pg (ref 26.0–34.0)
MCHC: 32.7 g/dL (ref 30.0–36.0)
MCV: 86.5 fL (ref 80.0–100.0)
Platelets: 463 10*3/uL — ABNORMAL HIGH (ref 150–400)
RBC: 5.05 MIL/uL (ref 3.87–5.11)
RDW: 13.2 % (ref 11.5–15.5)
WBC: 12.9 10*3/uL — ABNORMAL HIGH (ref 4.0–10.5)
nRBC: 0 % (ref 0.0–0.2)

## 2022-01-01 LAB — COMPREHENSIVE METABOLIC PANEL
ALT: 18 U/L (ref 0–44)
AST: 22 U/L (ref 15–41)
Albumin: 4.3 g/dL (ref 3.5–5.0)
Alkaline Phosphatase: 68 U/L (ref 38–126)
Anion gap: 12 (ref 5–15)
BUN: 13 mg/dL (ref 6–20)
CO2: 19 mmol/L — ABNORMAL LOW (ref 22–32)
Calcium: 10.8 mg/dL — ABNORMAL HIGH (ref 8.9–10.3)
Chloride: 106 mmol/L (ref 98–111)
Creatinine, Ser: 0.9 mg/dL (ref 0.44–1.00)
GFR, Estimated: >60 mL/min (ref >60)
Glucose, Bld: 181 mg/dL — ABNORMAL HIGH (ref 70–99)
Potassium: 4.1 mmol/L (ref 3.5–5.1)
Sodium: 137 mmol/L (ref 135–145)
Total Bilirubin: 0.6 mg/dL (ref 0.3–1.2)
Total Protein: 8.1 g/dL (ref 6.5–8.1)

## 2022-01-01 LAB — LIPASE, BLOOD: Lipase: 37 U/L (ref 11–51)

## 2022-01-01 MED ORDER — PROMETHAZINE HCL 25 MG PO TABS
12.5000 mg | ORAL_TABLET | Freq: Four times a day (QID) | ORAL | Status: DC | PRN
  Filled 2022-01-01: qty 1

## 2022-01-01 MED ORDER — SODIUM CHLORIDE 0.9 % IV SOLN: INTRAVENOUS | Status: DC

## 2022-01-01 MED ORDER — LORAZEPAM 2 MG/ML IJ SOLN
1.0000 mg | Freq: Four times a day (QID) | INTRAMUSCULAR | Status: DC | PRN
  Administered 2022-01-03 – 2022-01-05 (×2): 1 mg via INTRAVENOUS
  Filled 2022-01-01 (×2): qty 1

## 2022-01-01 MED ORDER — CITALOPRAM HYDROBROMIDE 20 MG PO TABS
40.0000 mg | ORAL_TABLET | Freq: Every day | ORAL | Status: DC
Start: 2022-01-02 — End: 2022-01-02

## 2022-01-01 MED ORDER — CITALOPRAM HYDROBROMIDE 10 MG PO TABS: 40.0000 mg | ORAL_TABLET | Freq: Every day | ORAL | Status: DC

## 2022-01-01 MED ORDER — SODIUM CHLORIDE (PF) 0.9 % IJ SOLN
INTRAMUSCULAR | Status: AC
  Filled 2022-01-01: qty 50

## 2022-01-01 MED ORDER — SODIUM CHLORIDE 0.9 % IV SOLN
12.5000 mg | Freq: Four times a day (QID) | INTRAVENOUS | Status: DC | PRN
  Administered 2022-01-01 – 2022-01-03 (×4): 12.5 mg via INTRAVENOUS
  Filled 2022-01-01 (×2): qty 0.5
  Filled 2022-01-01 (×3): qty 12.5

## 2022-01-01 MED ORDER — HYDROMORPHONE HCL 1 MG/ML IJ SOLN
1.0000 mg | Freq: Once | INTRAMUSCULAR | Status: AC
  Administered 2022-01-01: 1 mg via INTRAVENOUS
  Filled 2022-01-01: qty 1

## 2022-01-01 MED ORDER — PROCHLORPERAZINE EDISYLATE 10 MG/2ML IJ SOLN
5.0000 mg | Freq: Once | INTRAMUSCULAR | Status: AC
  Administered 2022-01-01: 5 mg via INTRAVENOUS
  Filled 2022-01-01: qty 2

## 2022-01-01 MED ORDER — OXYCODONE HCL 5 MG PO TABS: 5.0000 mg | ORAL_TABLET | Freq: Four times a day (QID) | ORAL | Status: DC | PRN

## 2022-01-01 MED ORDER — LABETALOL HCL 5 MG/ML IV SOLN
20.0000 mg | Freq: Four times a day (QID) | INTRAVENOUS | Status: DC | PRN
  Administered 2022-01-04: 20 mg via INTRAVENOUS
  Filled 2022-01-01 (×3): qty 4

## 2022-01-01 MED ORDER — BUPROPION HCL ER (SR) 150 MG PO TB12
150.0000 mg | ORAL_TABLET | Freq: Every day | ORAL | Status: DC
Start: 2022-01-02 — End: 2022-01-02

## 2022-01-01 MED ORDER — METHOCARBAMOL 500 MG PO TABS: 500.0000 mg | ORAL_TABLET | Freq: Three times a day (TID) | ORAL | Status: DC | PRN

## 2022-01-01 MED ORDER — BUPROPION HCL ER (SR) 150 MG PO TB12: 150.0000 mg | ORAL_TABLET | Freq: Every day | ORAL | Status: DC

## 2022-01-01 MED ORDER — MORPHINE SULFATE (PF) 4 MG/ML IV SOLN
4.0000 mg | Freq: Once | INTRAVENOUS | Status: AC
  Administered 2022-01-01: 4 mg via INTRAVENOUS
  Filled 2022-01-01: qty 1

## 2022-01-01 MED ORDER — IOHEXOL 300 MG/ML  SOLN
100.0000 mL | Freq: Once | INTRAMUSCULAR | Status: AC | PRN
  Administered 2022-01-01: 100 mL via INTRAVENOUS

## 2022-01-01 MED ORDER — ENOXAPARIN SODIUM 40 MG/0.4ML IJ SOSY
40.0000 mg | PREFILLED_SYRINGE | INTRAMUSCULAR | Status: DC
  Administered 2022-01-01 – 2022-01-07 (×7): 40 mg via SUBCUTANEOUS
  Filled 2022-01-01 (×7): qty 0.4

## 2022-01-01 MED ORDER — SODIUM CHLORIDE 0.9 % IV BOLUS
500.0000 mL | Freq: Once | INTRAVENOUS | Status: AC
  Administered 2022-01-01: 500 mL via INTRAVENOUS

## 2022-01-01 MED ORDER — ACETAMINOPHEN 500 MG PO TABS: 1000.0000 mg | ORAL_TABLET | Freq: Four times a day (QID) | ORAL | Status: DC | PRN

## 2022-01-01 MED ORDER — HYDROMORPHONE HCL 1 MG/ML IJ SOLN
0.5000 mg | INTRAMUSCULAR | Status: DC | PRN
  Administered 2022-01-01 – 2022-01-05 (×18): 1 mg via INTRAVENOUS
  Administered 2022-01-05: 0.5 mg via INTRAVENOUS
  Administered 2022-01-05 – 2022-01-07 (×13): 1 mg via INTRAVENOUS
  Filled 2022-01-01 (×32): qty 1

## 2022-01-01 MED ORDER — PANTOPRAZOLE SODIUM 40 MG PO TBEC
40.0000 mg | DELAYED_RELEASE_TABLET | Freq: Every day | ORAL | Status: DC
  Administered 2022-01-03: 40 mg via ORAL
  Filled 2022-01-01: qty 1

## 2022-01-01 NOTE — ED Notes (Signed)
Replaced emesis bag, of fluid was present. Emesis was clear, orange/brown, with watery consistency. ?

## 2022-01-01 NOTE — ED Notes (Addendum)
Replaced emesis bag, of fluid was present. Emesis was clear, orange/brown, with watery consistency. ?

## 2022-01-01 NOTE — Consult Note (Signed)
? ? ? ? ?Consult Note ? ?Conley Simmondsheri Skalla ?March 10, 1971  ?[id number].   ? ?Requesting MD: Dr. Jacqulyn BathLong ?Chief Complaint/Reason for Consult: parastomal hernia ? ?HPI:  ?51 y.o. female with medical history significant for hypertension, ulcerative colitis s/p colectomy with colostomy in 2018, parastomal hernia s/p laparoscopic repair 11/13/2021 by Dr. Michaell CowingGross who [facility] with abdominal pain, nausea and vomiting which started acutely around 5 AM. Emesis has been bilious but non-bloody. She also has pain at her ostomy though still productive of stool. She states output has been decreased initially. Normally colostomy output is more oatmeal like consistency, current output watery. She denies any recent lifting/straining. She had gone back to work but job is sedentary. Denies fever/chills, chest pain, urinary symptoms. She has had some coughing/sneezing with allergies but no other respiratory illness or straining of abdominal muscles.  ? ?She has history of lap repair of parastomal hernia by Dr. Michaell CowingGross as above with CT scan showing SBO secondary to parastomal hernia. Work up in ED otherwise significant for WBC mildly elevated to 12.9. ? ? ?ROS: ?Review of Systems  ?Constitutional:  Negative for chills and fever.  ?Respiratory:  Negative for cough and wheezing.   ?Cardiovascular:  Negative for chest pain and leg swelling.  ?Gastrointestinal:  Positive for abdominal pain, nausea and vomiting.  ?Genitourinary: Negative.   ? ?History reviewed. No pertinent family history. ? ?Past Medical History:  ?Diagnosis Date  ? Depression   ? Hypertension   ? Ulcerative colitis (HCC)   ? ? ?Past Surgical History:  ?Procedure Laterality Date  ? CATARACT EXTRACTION    ? right eye  ? CESAREAN SECTION    ? x3  ? COLECTOMY WITH COLOSTOMY CREATION/HARTMANN PROCEDURE  2018  ? [place]  ? LAPAROSCOPIC PARASTOMAL HERNIA N/A 11/13/2021  ? Procedure: LAPAROSCOPIC REPAIR OF INCARCERATED  PARASTOMAL HERNIA  WITH LYSIS OF ADHESIONS, LEFT TAP BLACK;   Surgeon: Karie SodaGross, Steven, MD;  Location: [facility];  Service: General;  Laterality: N/A;  ? ? ?Social History:  reports that she has never smoked. She has never used smokeless tobacco. She reports that she does not drink alcohol and does not use drugs. ? ?Allergies:  ?Allergies  ?Allergen Reactions  ? Zofran [Ondansetron] Nausea And Vomiting  ? ? ?(Not in a hospital admission) ? ? ?Blood pressure 120/90, pulse 75, temperature 97.7 ?F (36.5 ?C), temperature source Oral, resp. rate (!) 21, height 5\' 7"  (1.702 m), weight 117.9 kg, SpO2 93 %. ?Physical Exam: ?General: pleasant, WD, female who is laying in bed in NAD ?HEENT: head is normocephalic, atraumatic.  Sclera are noninjected.  Pupils equal and round. EOMs intact.  Ears and nose without any masses or lesions.  Mouth is pink and moist ?Heart: regular, rate, and rhythm.  Normal s1,s2. No obvious murmurs, gallops, or rubs noted.  Palpable radial and pedal pulses bilaterally ?Lungs: CTAB, no wheezes, rhonchi, or rales noted.  Respiratory effort nonlabored ?Abd: soft, BS hypoactive, mildly distended. Generalized TTP with more focal ttp around stoma - soft partially reducible parastomal hernia at this site. Liquid stool in ostomy pouch. NGT in place with bilious fluid in cannister ?MSK: all 4 extremities are symmetrical with no cyanosis, clubbing, or edema. ?Skin: warm and dry with no masses, lesions, or rashes ?Neuro: Cranial nerves 2-12 grossly intact, sensation is normal throughout ?Psych: A&Ox3 with an appropriate affect.  ? ? ?Results for orders placed or performed during the hospital encounter of 01/01/22 (from the past 48 hour(s))  ?Lipase, blood  Status: None  ? Collection Time: 01/01/22  7:50 AM  ?Result Value Ref Range  ? Lipase 37 11 - 51 U/L  ?  Comment: Performed at [facility], [address] [place], [place] [address]  ?Comprehensive metabolic panel     Status: Abnormal  ? Collection Time: 01/01/22  7:50 AM  ?Result Value Ref Range  ?  Sodium 137 135 - 145 mmol/L  ? Potassium 4.1 3.5 - 5.1 mmol/L  ? Chloride 106 98 - 111 mmol/L  ? CO2 19 (L) 22 - 32 mmol/L  ? Glucose, Bld 181 (H) 70 - 99 mg/dL  ?  Comment: Glucose reference range applies only to samples taken after fasting for at least 8 hours.  ? BUN 13 6 - 20 mg/dL  ? Creatinine, Ser 0.90 0.44 - 1.00 mg/dL  ? Calcium 10.8 (H) 8.9 - 10.3 mg/dL  ? Total Protein 8.1 6.5 - 8.1 g/dL  ? Albumin 4.3 3.5 - 5.0 g/dL  ? AST 22 15 - 41 U/L  ? ALT 18 0 - 44 U/L  ? Alkaline Phosphatase 68 38 - 126 U/L  ? Total Bilirubin 0.6 0.3 - 1.2 mg/dL  ? GFR, Estimated >60 >60 mL/min  ?  Comment: (NOTE) ?Calculated using the CKD-EPI Creatinine Equation (2021) ?  ? Anion gap 12 5 - 15  ?  Comment: Performed at [facility], [address], [place], [place] [address]  ?CBC     Status: Abnormal  ? Collection Time: 01/01/22  7:50 AM  ?Result Value Ref Range  ? WBC 12.9 (H) 4.0 - 10.5 K/uL  ? RBC 5.05 3.87 - 5.11 MIL/uL  ? Hemoglobin 14.3 12.0 - 15.0 g/dL  ? HCT 43.7 36.0 - 46.0 %  ? MCV 86.5 80.0 - 100.0 fL  ? MCH 28.3 26.0 - 34.0 pg  ? MCHC 32.7 30.0 - 36.0 g/dL  ? RDW 13.2 11.5 - 15.5 %  ? Platelets 463 (H) 150 - 400 K/uL  ? nRBC 0.0 0.0 - 0.2 %  ?  Comment: Performed at [facility], [address] [place], [place] [address]  ? ?DG Abdomen 1 View ? ?Result Date: 01/01/2022 ?CLINICAL DATA:  NG tube placement EXAM: ABDOMEN - 1 VIEW COMPARISON:  None. FINDINGS: Esophagogastric tube with tip below the diaphragm, side port level gastroesophageal junction. Nonobstructive pattern of included bowel gas. IMPRESSION: Esophagogastric tube with tip below the diaphragm, side port level gastroesophageal junction. Recommend advancement to ensure subdiaphragmatic position of both tip and side port. Electronically Signed   By: Jearld Lesch M.D.   On: 01/01/2022 11:33  ? ?CT ABDOMEN PELVIS W CONTRAST ? ?Result Date: 01/01/2022 ?CLINICAL DATA:  Abdominal pain EXAM: CT ABDOMEN AND PELVIS WITH CONTRAST  TECHNIQUE: Multidetector CT imaging of the abdomen and pelvis was performed using the standard protocol following bolus administration of intravenous contrast. RADIATION DOSE REDUCTION: This exam was performed according to the departmental dose-optimization program which includes automated exposure control, adjustment of the mA and/or kV according to patient size and/or use of iterative reconstruction technique. CONTRAST:  OMNIPAQUE IOHEXOL 300 MG/ML  SOLN COMPARISON:  CT abdomen and pelvis dated November 10, 2021 FINDINGS: Lower chest: Numerous centrilobular nodules. Patulous and fluid-filled esophagus. Hepatobiliary: No suspicious liver lesions. Distended gallbladder with no wall thickening. Cholelithiasis. No biliary ductal dilation. Pancreas: Unremarkable. No pancreatic ductal dilatation or surrounding inflammatory changes. Spleen: Normal in size without focal abnormality. Adrenals/Urinary Tract: Bilateral adrenal glands are unremarkable. No hydronephrosis.  Bilateral nonobstructing renal stones. Bladder is decompressed. Stomach/Bowel: Stomach is normal in appearance. Left lower quadrant colostomy. Numerous dilated loops of small bowel with transition point at left abdominal parastomal hernia dilated loops of small bowel are seen within the hernia. Colon is decompressed. A small amount of fluid is seen within the hernia sac. No free air or pneumatosis. Vascular/Lymphatic: No significant vascular findings are present. No enlarged abdominal or pelvic lymph nodes. Reproductive: Uterus and bilateral adnexa are unremarkable. Other: No abdominal wall hernia or abnormality. No abdominopelvic ascites. No free air. Musculoskeletal: No acute or significant osseous findings. IMPRESSION: 1. Small bowel obstruction secondary to parastomal hernia. A small amount of fluid is seen within the hernia sac. No free air or pneumatosis. 2. Numerous centrilobular nodules the partially visualized lung bases, likely due to aspiration  given presence of patulous fluid-filled esophagus. Electronically Signed   By: Allegra Lai M.D.   On: 01/01/2022 09:59   ? ? ? ?Assessment/Plan ?Parastomal hernia s/p laparoscopic repair 11/13/2021 by Dr. Reece Agar

## 2022-01-01 NOTE — ED Provider Notes (Signed)
? ?Emergency Department Provider Note ? ? ?I have reviewed the triage vital signs and the nursing notes. ? ? ?HISTORY ? ?Chief Complaint ?Abdominal Pain and Emesis ? ? ?HPI ?Lindsay Diaz is a 51 y.o. female with PMH of UC with ostomy presents to the ED with return of abdominal pain and nausea/vomiting. Symptoms began abruptly at 5 AM today. Notes some pain at the ostomy site but is passing stool. Notes a prior SBO in March of this year requiring hospitalization and laproscopic repair of the parastomal hernia with Dr. Michaell CowingGross on 11/13/21. Patient denies any fever/chills. No CP.  ? ?Past Medical History:  ?Diagnosis Date  ? Depression   ? Hypertension   ? Ulcerative colitis (HCC)   ? ? ?Review of Systems ? ?Constitutional: No fever/chills ?Eyes: No visual changes. ?ENT: No sore throat. ?Cardiovascular: Denies chest pain. ?Respiratory: Denies shortness of breath. ?Gastrointestinal: Positive abdominal pain. Positive nausea and vomiting.  No diarrhea.  No constipation. ?Genitourinary: Negative for dysuria. ?Musculoskeletal: Negative for back pain. ?Skin: Negative for rash. ? ? ?____________________________________________ ? ? ?PHYSICAL EXAM: ? ?VITAL SIGNS: ?ED Triage Vitals  ?Enc Vitals Group  ?   BP 01/01/22 0737 (!) 140/105  ?   Pulse Rate 01/01/22 0737 95  ?   Resp 01/01/22 0737 18  ?   Temp 01/01/22 0737 97.7 ?F (36.5 ?C)  ?   Temp Source 01/01/22 0737 Oral  ?   SpO2 01/01/22 0737 100 %  ?   Weight 01/01/22 0739 260 lb (117.9 kg)  ?   Height 01/01/22 0739 5\' 7"  (1.702 m)  ? ?Constitutional: Alert and oriented. Patient with frequent dry heaving on my initial evaluation.  ?Eyes: Conjunctivae are normal.  ?Head: Atraumatic. ?Nose: No congestion/rhinnorhea. ?Mouth/Throat: Mucous membranes are moist.  ?Neck: No stridor.   ?Cardiovascular: Normal rate, regular rhythm. Good peripheral circulation. Grossly normal heart sounds.   ?Respiratory: Normal respiratory effort.  No retractions. Lungs CTAB. ?Gastrointestinal:  Patient's abdomen is distended with tenderness at the base of the ostomy. No herniation of bowel through the soma at this time. Relatively non-tender throughout the remaining abdomen.  ?Musculoskeletal: No gross deformities of extremities. ?Neurologic:  Normal speech and language.  ?Skin:  Skin is warm, dry and intact. No rash noted. ? ?____________________________________________ ?  ?LABS ?(all labs ordered are listed, but only abnormal results are displayed) ? ?Labs Reviewed  ?COMPREHENSIVE METABOLIC PANEL - Abnormal; Notable for the following components:  ?    Result Value  ? CO2 19 (*)   ? Glucose, Bld 181 (*)   ? Calcium 10.8 (*)   ? All other components within normal limits  ?CBC - Abnormal; Notable for the following components:  ? WBC 12.9 (*)   ? Platelets 463 (*)   ? All other components within normal limits  ?URINALYSIS, ROUTINE W REFLEX MICROSCOPIC - Abnormal; Notable for the following components:  ? APPearance HAZY (*)   ? Specific Gravity, Urine <1.005 (*)   ? Hgb urine dipstick SMALL (*)   ? Protein, ur 30 (*)   ? Bacteria, UA RARE (*)   ? Squamous Epithelial / LPF >50 (*)   ? All other components within normal limits  ?BASIC METABOLIC PANEL - Abnormal; Notable for the following components:  ? Chloride 112 (*)   ? CO2 21 (*)   ? Glucose, Bld 147 (*)   ? BUN 32 (*)   ? Creatinine, Ser 1.09 (*)   ? All other components within normal limits  ?CBC -  Abnormal; Notable for the following components:  ? Platelets 440 (*)   ? All other components within normal limits  ?BASIC METABOLIC PANEL - Abnormal; Notable for the following components:  ? Chloride 112 (*)   ? CO2 20 (*)   ? BUN 24 (*)   ? Calcium 8.8 (*)   ? All other components within normal limits  ?BASIC METABOLIC PANEL - Abnormal; Notable for the following components:  ? Potassium 3.1 (*)   ? Chloride 112 (*)   ? CO2 20 (*)   ? Glucose, Bld 118 (*)   ? Calcium 8.4 (*)   ? All other components within normal limits  ?CBC - Abnormal; Notable for the  following components:  ? RBC 3.84 (*)   ? Hemoglobin 10.7 (*)   ? HCT 34.2 (*)   ? All other components within normal limits  ?BASIC METABOLIC PANEL - Abnormal; Notable for the following components:  ? Chloride 112 (*)   ? CO2 21 (*)   ? Glucose, Bld 114 (*)   ? Calcium 7.9 (*)   ? All other components within normal limits  ?RENAL FUNCTION PANEL - Abnormal; Notable for the following components:  ? Potassium 3.2 (*)   ? Chloride 112 (*)   ? CO2 20 (*)   ? Calcium 8.3 (*)   ? Phosphorus 1.4 (*)   ? Albumin 2.5 (*)   ? All other components within normal limits  ?CBC WITH DIFFERENTIAL/PLATELET - Abnormal; Notable for the following components:  ? RBC 3.73 (*)   ? Hemoglobin 10.5 (*)   ? HCT 32.2 (*)   ? nRBC 0.3 (*)   ? Abs Immature Granulocytes 0.49 (*)   ? All other components within normal limits  ?SURGICAL PCR SCREEN  ?LIPASE, BLOOD  ?MAGNESIUM  ?COMPREHENSIVE METABOLIC PANEL  ?MAGNESIUM  ?PHOSPHORUS  ?TYPE AND SCREEN  ?ABO/RH  ? ? ?____________________________________________ ? ? ?PROCEDURES ? ?Procedure(s) performed:  ? ?Procedures ? ?None  ?____________________________________________ ? ? ?INITIAL IMPRESSION / ASSESSMENT AND PLAN / ED COURSE ? ?Pertinent labs & imaging results that were available during my care of the patient were reviewed by me and considered in my medical decision making (see chart for details). ?  ?This patient is Presenting for Evaluation of abdominal pain, which does require a range of treatment options, and is a complaint that involves a high risk of morbidity and mortality. ? ?The Differential Diagnoses includes but is not exclusive to acute cholecystitis, intrathoracic causes for epigastric abdominal pain, gastritis, duodenitis, pancreatitis, small bowel or large bowel obstruction, abdominal aortic aneurysm, hernia, gastritis, etc. ? ? ?Critical Interventions-  ?  ?Medications  ?promethazine (PHENERGAN) 12.5 mg in sodium chloride 0.9 % 50 mL IVPB ( Intravenous MAR Unhold 01/04/22 1638)   ?sodium chloride (PF) 0.9 % injection (has no administration in time range)  ?labetalol (NORMODYNE) injection 20 mg ( Intravenous MAR Unhold 01/04/22 1638)  ?LORazepam (ATIVAN) injection 1 mg (1 mg Intravenous Given 01/05/22 2257)  ?enoxaparin (LOVENOX) injection 40 mg (40 mg Subcutaneous Given 01/06/22 2148)  ?HYDROmorphone (DILAUDID) injection 0.5-1 mg (1 mg Intravenous Given 01/07/22 0629)  ?pantoprazole (PROTONIX) injection 40 mg (40 mg Intravenous Given 01/07/22 0933)  ?methocarbamol (ROBAXIN) 500 mg in dextrose 5 % 50 mL IVPB (has no administration in time range)  ?Chlorhexidine Gluconate Cloth 2 % PADS 6 each (6 each Topical Given 01/07/22 0933)  ?dextrose 5% lactated ringers 1,000 mL with potassium chloride 40 mEq infusion ( Intravenous New Bag/Given 01/06/22 0604)  ?traZODone (  DESYREL) tablet 25 mg (25 mg Oral Given 01/06/22 2302)  ?sodium chloride 0.9 % bolus 500 mL (500 mLs Intravenous New Bag/Given 01/01/22 0909)  ?morphine (PF) 4 MG/ML injection 4 mg (4 mg Intravenous Given 01/01/22 0910)  ?iohexol (OMNIPAQUE) 300 MG/ML solution 100 mL (100 mLs Intravenous Contrast Given 01/01/22 0928)  ?HYDROmorphone (DILAUDID) injection 1 mg (1 mg Intravenous Given 01/01/22 1158)  ?prochlorperazine (COMPAZINE) injection 5 mg (5 mg Intravenous Given 01/01/22 1952)  ?bisacodyl (DULCOLAX) suppository 10 mg (10 mg Rectal Given 01/03/22 1224)  ?ceFAZolin (ANCEF) IVPB 2g/100 mL premix (0 g Intravenous Duplicate 01/04/22 1840)  ?potassium chloride 10 mEq in 100 mL IVPB (0 mEq Intravenous Stopped 01/04/22 1838)  ?ceFAZolin (ANCEF) 2-4 GM/100ML-% IVPB (  Duplicate 01/04/22 1313)  ?chlorhexidine (PERIDEX) 0.12 % solution 15 mL (0 mLs Mouth/Throat Duplicate 01/04/22 1314)  ?  Or  ?MEDLINE mouth rinse ( Mouth Rinse See Alternative 01/04/22 1314)  ?scopolamine (TRANSDERM-SCOP) 1 MG/3DAYS (  Override pull for Anesthesia 01/04/22 1406)  ?acetaminophen (OFIRMEV) 10 MG/ML IV (  Override pull for Anesthesia 01/04/22 1543)  ?acetaminophen (OFIRMEV) IV 1,000  mg (1,000 mg Intravenous New Bag/Given 01/05/22 1143)  ?potassium PHOSPHATE 30 mmol in dextrose 5 % 500 mL infusion (30 mmol Intravenous New Bag/Given 01/06/22 0608)  ? ? ?Reassessment after intervention: Symptoms improve

## 2022-01-01 NOTE — H&P (Signed)
?History and Physical  ? ? ?Lindsay SimmondsCheri Attar WUJ:[id number] DOB: 1971/05/10 DOA: 01/01/2022 ? ?PCP: [facility] (Confirm with patient/family/NH records and if not entered, [facility]) ?Patient coming from: Home ? ?I have personally briefly reviewed patient's old medical records in [facility] ? ?Chief Complaint: Belly hurts, nauseous vomiting ? ?HPI: Lindsay Diaz is a 51 y.o. female with medical history significant of ulcerative colitis status post colostomy, recent SBO with parastomal hernia and bowel obstruction, HTN, anxiety/depression, presented with recurrent abdominal pain nauseous vomiting. ? ?Patient developed constipation 2 days ago, and yesterday, patient started to have cramping-like abdominal pain around colostomy site, after passing some hard stool and small diarrhea, abdominal pain was initially partially relieved but then came back overnight and has become constant this morning.  Associated with multiple bouts of nauseous and vomiting of stomach content, light greenish, no blood.  Denies any fever or chills. ? ? ?ED Course: Vital signs, afebrile, no tachycardia no hypotension. ? ?CT abdomen pelvis showed SBO secondary to parastomal hernia. ? ?Review of Systems: As per HPI otherwise 14 point review of systems negative.  ? ? ?Past Medical History:  ?Diagnosis Date  ? Depression   ? Hypertension   ? Ulcerative colitis (HCC)   ? ? ?Past Surgical History:  ?Procedure Laterality Date  ? CATARACT EXTRACTION    ? right eye  ? CESAREAN SECTION    ? x3  ? COLECTOMY WITH COLOSTOMY CREATION/HARTMANN PROCEDURE  2018  ? In [place]  ? LAPAROSCOPIC PARASTOMAL HERNIA N/A 11/13/2021  ? Procedure: LAPAROSCOPIC REPAIR OF INCARCERATED  PARASTOMAL HERNIA  WITH LYSIS OF ADHESIONS, LEFT TAP BLACK;  Surgeon: Karie SodaGross, Steven, MD;  Location: [facility];  Service: General;  Laterality: N/A;  ? ? ? reports that she has never smoked. She has never used smokeless tobacco.  She reports that she does not drink alcohol and does not use drugs. ? ?Allergies  ?Allergen Reactions  ? Zofran [Ondansetron] Nausea And Vomiting  ? ? ?History reviewed. No pertinent family history. ? ? ?Prior to Admission medications   ?Medication Sig Start Date End Date Taking? Authorizing Provider  ?acetaminophen (TYLENOL) 500 MG tablet Take 2 tablets (1,000 mg total) by mouth every 6 (six) hours as needed. 11/16/21   Barnetta Chapelsborne, Kelly, PA-C  ?atenolol (TENORMIN) 50 MG tablet Take 50 mg by mouth at bedtime.    [provider]  ?buPROPion (WELLBUTRIN SR) 150 MG 12 hr tablet Take 150 mg by mouth daily.    [provider]  ?Cholecalciferol (VITAMIN D-3) 125 MCG (5000 UT) TABS Take 5,000 Units by mouth at bedtime.    [provider]  ?citalopram (CELEXA) 40 MG tablet Take 40 mg by mouth daily.    [provider]  ?methocarbamol (ROBAXIN) 500 MG tablet Take 1-2 tablets (500-1,000 mg total) by mouth every 8 (eight) hours as needed for muscle spasms. 11/16/21   Barnetta Chapelsborne, Kelly, PA-C  ?oxyCODONE (OXY IR/ROXICODONE) 5 MG immediate release tablet Take 1 tablet (5 mg total) by mouth every 6 (six) hours as needed for moderate pain. 11/16/21   Barnetta Chapelsborne, Kelly, PA-C  ?pantoprazole (PROTONIX) 40 MG tablet Take 1 tablet (40 mg total) by mouth daily. 11/17/21   Rolly SalterPatel, Pranav M, MD  ?Turmeric 500 MG CAPS Take 500 mg by mouth at bedtime.    [provider]  ?zolpidem (AMBIEN) 10 MG tablet Take 10 mg by mouth at bedtime.    [provider]  ? ? ?  Physical Exam: ?Vitals:  ? 01/01/22 1130 01/01/22 1145 01/01/22 1200 01/01/22 1230  ?BP: (!) 136/93 (!) 127/97 (!) 125/98 120/90  ?Pulse: 70 73 70 75  ?Resp: (!) 22 19 19  (!) 21  ?Temp:      ?TempSrc:      ?SpO2: 92% 95% 95% 93%  ?Weight:      ?Height:      ? ? ?Constitutional: NAD, calm, comfortable ?Vitals:  ? 01/01/22 1130 01/01/22 1145 01/01/22 1200 01/01/22 1230  ?BP: (!) 136/93 (!) 127/97 (!) 125/98 120/90  ?Pulse: 70 73 70 75  ?Resp: (!) 22  19 19  (!) 21  ?Temp:      ?TempSrc:      ?SpO2: 92% 95% 95% 93%  ?Weight:      ?Height:      ? ?Eyes: PERRL, lids and conjunctivae normal ?ENMT: Mucous membranes are dry. Posterior pharynx clear of any exudate or lesions.Normal dentition.  ?Neck: normal, supple, no masses, no thyromegaly ?Respiratory: clear to auscultation bilaterally, no wheezing, no crackles. Normal respiratory effort. No accessory muscle use.  ?Cardiovascular: Regular rate and rhythm, no murmurs / rubs / gallops. No extremity edema. 2+ pedal pulses. No carotid bruits.  ?Abdomen: tenderness around colostomy site, no rebound no guarding, increasing bowel sounds, no masses palpated. No hepatosplenomegaly.  ?Musculoskeletal: no clubbing / cyanosis. No joint deformity upper and lower extremities. Good ROM, no contractures. Normal muscle tone.  ?Skin: no rashes, lesions, ulcers. No induration ?Neurologic: CN 2-12 grossly intact. Sensation intact, DTR normal. Strength 5/5 in all 4.  ?Psychiatric: Normal judgment and insight. Alert and oriented x 3. Normal mood.  ? ? ? ?Labs on Admission: I have personally reviewed following labs and imaging studies ? ?CBC: ?Recent Labs  ?Lab 01/01/22 ?0750  ?WBC 12.9*  ?HGB 14.3  ?HCT 43.7  ?MCV 86.5  ?PLT 463*  ? ?Basic Metabolic Panel: ?Recent Labs  ?Lab 01/01/22 ?0750  ?NA 137  ?K 4.1  ?CL 106  ?CO2 19*  ?GLUCOSE 181*  ?BUN 13  ?CREATININE 0.90  ?CALCIUM 10.8*  ? ?GFR: ?Estimated Creatinine Clearance: 97.1 mL/min (by C-G formula based on SCr of 0.9 mg/dL). ?Liver Function Tests: ?Recent Labs  ?Lab 01/01/22 ?0750  ?AST 22  ?ALT 18  ?ALKPHOS 68  ?BILITOT 0.6  ?PROT 8.1  ?ALBUMIN 4.3  ? ?Recent Labs  ?Lab 01/01/22 ?0750  ?LIPASE 37  ? ?No results for input(s): AMMONIA in the last 168 hours. ?Coagulation Profile: ?No results for input(s): INR, PROTIME in the last 168 hours. ?Cardiac Enzymes: ?No results for input(s): CKTOTAL, CKMB, CKMBINDEX, TROPONINI in the last 168 hours. ?BNP (last 3 results) ?No results for  input(s): PROBNP in the last 8760 hours. ?HbA1C: ?No results for input(s): HGBA1C in the last 72 hours. ?CBG: ?No results for input(s): GLUCAP in the last 168 hours. ?Lipid Profile: ?No results for input(s): CHOL, HDL, LDLCALC, TRIG, CHOLHDL, LDLDIRECT in the last 72 hours. ?Thyroid Function Tests: ?No results for input(s): TSH, T4TOTAL, FREET4, T3FREE, THYROIDAB in the last 72 hours. ?Anemia Panel: ?No results for input(s): VITAMINB12, FOLATE, FERRITIN, TIBC, IRON, RETICCTPCT in the last 72 hours. ?Urine analysis: ?   ?Component Value Date/Time  ? COLORURINE YELLOW 11/10/2021 1732  ? APPEARANCEUR CLEAR 11/10/2021 1732  ? LABSPEC 1.010 11/10/2021 1732  ? PHURINE 7.0 11/10/2021 1732  ? GLUCOSEU NEGATIVE 11/10/2021 1732  ? HGBUR SMALL (A) 11/10/2021 1732  ? BILIRUBINUR NEGATIVE 11/10/2021 1732  ? KETONESUR NEGATIVE 11/10/2021 1732  ? PROTEINUR 30 (A) 11/10/2021 1732  ?  NITRITE NEGATIVE 11/10/2021 1732  ? LEUKOCYTESUR NEGATIVE 11/10/2021 1732  ? ? ?Radiological Exams on Admission: ?DG Abdomen 1 View ? ?Result Date: 01/01/2022 ?CLINICAL DATA:  NG tube placement EXAM: ABDOMEN - 1 VIEW COMPARISON:  None. FINDINGS: Esophagogastric tube with tip below the diaphragm, side port level gastroesophageal junction. Nonobstructive pattern of included bowel gas. IMPRESSION: Esophagogastric tube with tip below the diaphragm, side port level gastroesophageal junction. Recommend advancement to ensure subdiaphragmatic position of both tip and side port. Electronically Signed   By: Jearld Lesch M.D.   On: 01/01/2022 11:33  ? ?CT ABDOMEN PELVIS W CONTRAST ? ?Result Date: 01/01/2022 ?CLINICAL DATA:  Abdominal pain EXAM: CT ABDOMEN AND PELVIS WITH CONTRAST TECHNIQUE: Multidetector CT imaging of the abdomen and pelvis was performed using the standard protocol following bolus administration of intravenous contrast. RADIATION DOSE REDUCTION: This exam was performed according to the departmental dose-optimization program which includes automated  exposure control, adjustment of the mA and/or kV according to patient size and/or use of iterative reconstruction technique. CONTRAST:  OMNIPAQUE IOHEXOL 300 MG/ML  SOLN COMPARISON:  CT abdomen and pe

## 2022-01-01 NOTE — ED Triage Notes (Signed)
Patient c/o emesis at 0500 today. Patient also states that she has abdominal pain at her ostomy site. Patient states she did empty stool this AM. ?Patient states she had a bowel obstruction on 11/10/21. ?

## 2022-01-02 DIAGNOSIS — K56609 Unspecified intestinal obstruction, unspecified as to partial versus complete obstruction: Secondary | ICD-10-CM | POA: Diagnosis not present

## 2022-01-02 LAB — CBC
HCT: 40.9 % (ref 36.0–46.0)
Hemoglobin: 13.1 g/dL (ref 12.0–15.0)
MCH: 27.8 pg (ref 26.0–34.0)
MCHC: 32 g/dL (ref 30.0–36.0)
MCV: 86.8 fL (ref 80.0–100.0)
Platelets: 440 10*3/uL — ABNORMAL HIGH (ref 150–400)
RBC: 4.71 MIL/uL (ref 3.87–5.11)
RDW: 13.8 % (ref 11.5–15.5)
WBC: 6.9 10*3/uL (ref 4.0–10.5)
nRBC: 0 % (ref 0.0–0.2)

## 2022-01-02 LAB — BASIC METABOLIC PANEL
Anion gap: 9 (ref 5–15)
BUN: 32 mg/dL — ABNORMAL HIGH (ref 6–20)
CO2: 21 mmol/L — ABNORMAL LOW (ref 22–32)
Calcium: 9.2 mg/dL (ref 8.9–10.3)
Chloride: 112 mmol/L — ABNORMAL HIGH (ref 98–111)
Creatinine, Ser: 1.09 mg/dL — ABNORMAL HIGH (ref 0.44–1.00)
GFR, Estimated: >60 mL/min (ref >60)
Glucose, Bld: 147 mg/dL — ABNORMAL HIGH (ref 70–99)
Potassium: 3.8 mmol/L (ref 3.5–5.1)
Sodium: 142 mmol/L (ref 135–145)

## 2022-01-02 NOTE — Progress Notes (Signed)
? ?Progress Note ? ?   ?Subjective: ?Patient reports less pain overall but parastomal hernia still does not feel fully reduced. She had some more liquid stool output around midnight from stoma but has not had any since and no gas via stoma.  ? ?Objective: ?Vital signs in last 24 hours: ?Temp:  [98 ?F (36.7 ?C)-99.2 ?F (37.3 ?C)] 98.6 ?F (37 ?C) (04/26 0549) ?Pulse Rate:  [70-106] 104 (04/26 0549) ?Resp:  [16-23] 20 (04/26 0549) ?BP: (118-139)/(76-98) 123/82 (04/26 0549) ?SpO2:  [90 %-96 %] 92 % (04/26 0549) ?Last BM Date : 01/01/22 ? ?Intake/Output from previous day: ?04/25 0701 - 04/26 0700 ?In: 1883.6 [I.V.:1733.6; IV Piggyback:150] ?Out: 705 [Emesis/NG output:55; [id number] ?Intake/Output this shift: ?Total I/O ?In: 182.9 [I.V.:182.9] ?Out: 800 [Urine:500; Emesis/NG output:300] ? ?PE: ?General: pleasant, WD, obese female who is laying in bed in NAD ?Lungs: respiratory effort nonlabored ?Abd: soft, mild ttp around parastomal hernia, no peritonitis, hernia is softer but does not feel fully reducible, nothing in ostomy pouch this AM, NGT with bilious fluid  ?Psych: A&Ox3 with an appropriate affect.  ? ? ?Lab Results:  ?Recent Labs  ?  01/01/22 ?0750 01/02/22 ?[id number]  ?WBC 12.9* 6.9  ?HGB 14.3 13.1  ?HCT 43.7 40.9  ?PLT 463* 440*  ? ?BMET ?Recent Labs  ?  01/01/22 ?0750 01/02/22 ?[id number]  ?NA 137 142  ?K 4.1 3.8  ?CL 106 112*  ?CO2 19* 21*  ?GLUCOSE 181* 147*  ?BUN 13 32*  ?CREATININE 0.90 1.09*  ?CALCIUM 10.8* 9.2  ? ?PT/INR ?No results for input(s): LABPROT, INR in the last 72 hours. ?CMP  ?   ?Component Value Date/Time  ? NA 142 01/02/2022 0447  ? NA 138 05/20/2019 1527  ? K 3.8 01/02/2022 0447  ? CL 112 (H) 01/02/2022 0447  ? CO2 21 (L) 01/02/2022 0447  ? GLUCOSE 147 (H) 01/02/2022 0447  ? BUN 32 (H) 01/02/2022 0447  ? BUN 9 05/20/2019 1527  ? CREATININE 1.09 (H) 01/02/2022 0447  ? CALCIUM 9.2 01/02/2022 0447  ? PROT 8.1 01/01/2022 0750  ? PROT 6.7 05/20/2019 1527  ? ALBUMIN 4.3 01/01/2022 0750  ? ALBUMIN 3.9  05/20/2019 1527  ? AST 22 01/01/2022 0750  ? ALT 18 01/01/2022 0750  ? ALKPHOS 68 01/01/2022 0750  ? BILITOT 0.6 01/01/2022 0750  ? BILITOT <0.2 05/20/2019 1527  ? GFRNONAA >60 01/02/2022 0447  ? GFRAA 91 05/20/2019 1527  ? ?Lipase  ?   ?Component Value Date/Time  ? LIPASE 37 01/01/2022 0750  ? ? ? ? ? ?Studies/Results: ?DG Abd 1 View ? ?Result Date: 01/01/2022 ?CLINICAL DATA:  Evaluate NG placement. EXAM: ABDOMEN - 1 VIEW COMPARISON:  01/01/2022. FINDINGS: The bowel gas pattern is normal, the mid to lower abdomen is obscured from the field of view. An enteric tube terminates in the stomach and appears appropriate position. Multiple calcifications are noted of the right upper quadrant, compatible with cholelithiasis. IMPRESSION: 1. The enteric tube terminates in the stomach and is appropriate in position. 2. Nonobstructive bowel-gas pattern, however the mid to lower abdomen is obscured from the field of view. 3. Cholelithiasis. Electronically Signed   By: Brett Fairy M.D.   On: 01/01/2022 19:59  ? ?DG Abdomen 1 View ? ?Result Date: 01/01/2022 ?CLINICAL DATA:  NG tube placement EXAM: ABDOMEN - 1 VIEW COMPARISON:  None. FINDINGS: Esophagogastric tube with tip below the diaphragm, side port level gastroesophageal junction. Nonobstructive pattern of included bowel gas. IMPRESSION: Esophagogastric tube with tip below the diaphragm,  side port level gastroesophageal junction. Recommend advancement to ensure subdiaphragmatic position of both tip and side port. Electronically Signed   By: Delanna Ahmadi M.D.   On: 01/01/2022 11:33  ? ?CT ABDOMEN PELVIS W CONTRAST ? ?Result Date: 01/01/2022 ?CLINICAL DATA:  Abdominal pain EXAM: CT ABDOMEN AND PELVIS WITH CONTRAST TECHNIQUE: Multidetector CT imaging of the abdomen and pelvis was performed using the standard protocol following bolus administration of intravenous contrast. RADIATION DOSE REDUCTION: This exam was performed according to the departmental dose-optimization program  which includes automated exposure control, adjustment of the mA and/or kV according to patient size and/or use of iterative reconstruction technique. CONTRAST:  112mL OMNIPAQUE IOHEXOL 300 MG/ML  SOLN COMPARISON:  CT abdomen and pelvis dated November 10, 2021 FINDINGS: Lower chest: Numerous centrilobular nodules. Patulous and fluid-filled esophagus. Hepatobiliary: No suspicious liver lesions. Distended gallbladder with no wall thickening. Cholelithiasis. No biliary ductal dilation. Pancreas: Unremarkable. No pancreatic ductal dilatation or surrounding inflammatory changes. Spleen: Normal in size without focal abnormality. Adrenals/Urinary Tract: Bilateral adrenal glands are unremarkable. No hydronephrosis. Bilateral nonobstructing renal stones. Bladder is decompressed. Stomach/Bowel: Stomach is normal in appearance. Left lower quadrant colostomy. Numerous dilated loops of small bowel with transition point at left abdominal parastomal hernia dilated loops of small bowel are seen within the hernia. Colon is decompressed. A small amount of fluid is seen within the hernia sac. No free air or pneumatosis. Vascular/Lymphatic: No significant vascular findings are present. No enlarged abdominal or pelvic lymph nodes. Reproductive: Uterus and bilateral adnexa are unremarkable. Other: No abdominal wall hernia or abnormality. No abdominopelvic ascites. No free air. Musculoskeletal: No acute or significant osseous findings. IMPRESSION: 1. Small bowel obstruction secondary to parastomal hernia. A small amount of fluid is seen within the hernia sac. No free air or pneumatosis. 2. Numerous centrilobular nodules the partially visualized lung bases, likely due to aspiration given presence of patulous fluid-filled esophagus. Electronically Signed   By: Yetta Glassman M.D.   On: 01/01/2022 09:59   ? ?Anti-infectives: ?Anti-infectives (From admission, onward)  ? ? None  ? ?  ? ? ? ?Assessment/Plan ? Parastomal hernia s/p laparoscopic  repair 11/13/2021 by Dr. Johney Maine ?Recurrent parastomal hernia with SBO ?- CT w/ SBO secondary to parastomal hernia. Small amount of fluid within hernia sac without free air or pneumatosis ?- No current indication for emergency surgery ?- Given recent abdominal surgery it is optimal to avoid further surgical intervention acutely (for at least 6 weeks from surgery). Hopefully obstruction improves with conservative management but patient may ultimately require surgical intervention should she acutely worsen with concern for bowel compromise ?- Continue NGT for decompression - will discuss with MD if SBO protocol would be of any benefit or not, in setting of hernia not sure  ?- Keep K > 4 and Mg > 2 for bowel function ?- Mobilize for bowel function ?  ?  ?FEN: NPO/NGT, IVF @ 159ml/hr ?ID: none indicated from surgical perspective ?VTE: lovenox  ? LOS: 1 day  ? ?I reviewed CBC and BMET. Reviewed vitals and intake and output.  ? ?Norm Parcel, PA-C ?[facility] ?01/02/2022, 11:14 AM ?Please see Amion for pager number during day hours 7:00am-4:30pm ? ?

## 2022-01-02 NOTE — Progress Notes (Signed)
?PROGRESS NOTE ? ?Lindsay Diaz  INO:[id number] DOB: May 12, 1971 DOA: 01/01/2022 ?PCP: [facility]  ? ?Brief Narrative: ? ?Patient is a 51 year old female with history of ulcerative colitis status post colostomy, recent SBO with parastomal hernia, bowel obstruction, hypertension, anxiety/depression who presented with recurrent abdomen, nausea, vomiting.  CT abdomen/pelvis on presentation showed SBO secondary to parastomal hernia.  Patient admitted for the management of SBO.  General surgery following. ? ?Assessment & Plan: ? ?Principal Problem: ?  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 ? ? ?Recurrent SBO: Presented with abdomen pain, nausea, vomiting.  Imaging showed SBO on presentation.  NG tube placed.  Continue IV fluids, supportive care.  General surgery following.  SBO is most likely secondary to adhesions ? ?Suspected aspiration pneumonia: Currently respiratory status stable, on room air.  No signs and symptoms of pneumonia. ? ?Leukocytosis: Most likely reactive. resolved ? ?Hypertension: Blood pressure stable.  Continue as needed medications for severe hypertension ? ?Anxiety/depression: On bupropion, citalopram at home ? ?Morbid obesity: BMI of 40.7 ?  ? ? ?  ?  ? ?DVT prophylaxis:enoxaparin (LOVENOX) injection 40 mg Start: 01/01/22 2200 ? ? ?  Code Status: Full Code ? ?Family Communication: Family at bedside ? ?Patient status:Inpatient ? ?Patient is from :Home ? ?Anticipated discharge SJ:GGEZ ? ?Estimated DC date:1-2 days ? ? ?Consultants: Surgery ? ?Procedures:None ? ?Antimicrobials:  ?Anti-infectives (From admission, onward)  ? ? None  ? ?  ? ? ?Subjective: ? ?Patient seen and examined at the bedside this morning.  Hemodynamically stable.  Overall appears comfortable.  She says her abdominal pain has improved and she is not nauseous today.  She is passing some gas but no bowel movement. ? ?Objective: ?Vitals:  ? 01/01/22 1516 01/01/22 1700 01/01/22  2117 01/02/22 0549  ?BP: (!) 131/96 127/85 139/90 123/82  ?Pulse: 88 (!) 105 (!) 106 (!) 104  ?Resp: 16 18 18 20   ?Temp: 98 ?F (36.7 ?C) 98 ?F (36.7 ?C) 99.2 ?F (37.3 ?C) 98.6 ?F (37 ?C)  ?TempSrc: Oral Oral Oral   ?SpO2: 95% 94% 94% 92%  ?Weight:      ?Height:      ? ? ?Intake/Output Summary (Last 24 hours) at 01/02/2022 0928 ?Last data filed at 01/02/2022 0800 ?Gross per 24 hour  ?Intake 2066.48 ml  ?Output 905 ml  ?Net 1161.48 ml  ? ?Filed Weights  ? 01/01/22 0739  ?Weight: 117.9 kg  ? ? ?Examination: ? ?General exam: Overall comfortable, not in distress,obese ?HEENT: PERRL,NG tube ?Respiratory system:  no wheezes or crackles  ?Cardiovascular system: S1 & S2 heard, RRR.  ?Gastrointestinal system: Abdomen is nondistended, soft and nontender.  Bowel sounds heard, colostomy ?Central nervous system: Alert and oriented ?Extremities: No edema, no clubbing ,no cyanosis ?Skin: No rashes, no ulcers,no icterus   ? ? ?Data Reviewed: I have personally reviewed following labs and imaging studies ? ?CBC: ?Recent Labs  ?Lab 01/01/22 ?0750 01/02/22 ?01/04/22  ?WBC 12.9* 6.9  ?HGB 14.3 13.1  ?HCT 43.7 40.9  ?MCV 86.5 86.8  ?PLT 463* 440*  ? ?Basic Metabolic Panel: ?Recent Labs  ?Lab 01/01/22 ?0750 01/02/22 ?01/04/22  ?NA 137 142  ?K 4.1 3.8  ?CL 106 112*  ?CO2 19* 21*  ?GLUCOSE 181* 147*  ?BUN 13 32*  ?CREATININE 0.90 1.09*  ?CALCIUM 10.8* 9.2  ? ? ? ?No results found for this or any previous visit (from the past 240 hour(s)).  ? ?Radiology Studies: ?DG Abd 1 View ? ?Result Date: 01/01/2022 ?  CLINICAL DATA:  Evaluate NG placement. EXAM: ABDOMEN - 1 VIEW COMPARISON:  01/01/2022. FINDINGS: The bowel gas pattern is normal, the mid to lower abdomen is obscured from the field of view. An enteric tube terminates in the stomach and appears appropriate position. Multiple calcifications are noted of the right upper quadrant, compatible with cholelithiasis. IMPRESSION: 1. The enteric tube terminates in the stomach and is appropriate in position. 2.  Nonobstructive bowel-gas pattern, however the mid to lower abdomen is obscured from the field of view. 3. Cholelithiasis. Electronically Signed   By: Thornell Sartorius M.D.   On: 01/01/2022 19:59  ? ?DG Abdomen 1 View ? ?Result Date: 01/01/2022 ?CLINICAL DATA:  NG tube placement EXAM: ABDOMEN - 1 VIEW COMPARISON:  None. FINDINGS: Esophagogastric tube with tip below the diaphragm, side port level gastroesophageal junction. Nonobstructive pattern of included bowel gas. IMPRESSION: Esophagogastric tube with tip below the diaphragm, side port level gastroesophageal junction. Recommend advancement to ensure subdiaphragmatic position of both tip and side port. Electronically Signed   By: Jearld Lesch M.D.   On: 01/01/2022 11:33  ? ?CT ABDOMEN PELVIS W CONTRAST ? ?Result Date: 01/01/2022 ?CLINICAL DATA:  Abdominal pain EXAM: CT ABDOMEN AND PELVIS WITH CONTRAST TECHNIQUE: Multidetector CT imaging of the abdomen and pelvis was performed using the standard protocol following bolus administration of intravenous contrast. RADIATION DOSE REDUCTION: This exam was performed according to the departmental dose-optimization program which includes automated exposure control, adjustment of the mA and/or kV according to patient size and/or use of iterative reconstruction technique. CONTRAST:  OMNIPAQUE IOHEXOL 300 MG/ML  SOLN COMPARISON:  CT abdomen and pelvis dated November 10, 2021 FINDINGS: Lower chest: Numerous centrilobular nodules. Patulous and fluid-filled esophagus. Hepatobiliary: No suspicious liver lesions. Distended gallbladder with no wall thickening. Cholelithiasis. No biliary ductal dilation. Pancreas: Unremarkable. No pancreatic ductal dilatation or surrounding inflammatory changes. Spleen: Normal in size without focal abnormality. Adrenals/Urinary Tract: Bilateral adrenal glands are unremarkable. No hydronephrosis. Bilateral nonobstructing renal stones. Bladder is decompressed. Stomach/Bowel: Stomach is normal in  appearance. Left lower quadrant colostomy. Numerous dilated loops of small bowel with transition point at left abdominal parastomal hernia dilated loops of small bowel are seen within the hernia. Colon is decompressed. A small amount of fluid is seen within the hernia sac. No free air or pneumatosis. Vascular/Lymphatic: No significant vascular findings are present. No enlarged abdominal or pelvic lymph nodes. Reproductive: Uterus and bilateral adnexa are unremarkable. Other: No abdominal wall hernia or abnormality. No abdominopelvic ascites. No free air. Musculoskeletal: No acute or significant osseous findings. IMPRESSION: 1. Small bowel obstruction secondary to parastomal hernia. A small amount of fluid is seen within the hernia sac. No free air or pneumatosis. 2. Numerous centrilobular nodules the partially visualized lung bases, likely due to aspiration given presence of patulous fluid-filled esophagus. Electronically Signed   By: Allegra Lai M.D.   On: 01/01/2022 09:59   ? ?Scheduled Meds: ? buPROPion  150 mg Oral Daily  ? citalopram  40 mg Oral Daily  ? enoxaparin (LOVENOX) injection  40 mg Subcutaneous Q24H  ? pantoprazole  40 mg Oral Daily  ? ?Continuous Infusions: ? sodium chloride 125 mL/hr at 01/02/22 0003  ? promethazine (PHENERGAN) injection (IM or IVPB) 12.5 mg (01/01/22 2303)  ? ? ? LOS: 1 day  ? ?Burnadette Pop, MD ?[facility] ?P4/26/2023, 9:28 AM   ?

## 2022-01-02 NOTE — Progress Notes (Signed)
Transition of Care (TOC) Screening Note ? ?Patient Details  ?Name: Lindsay Diaz ?Date of Birth: 01/28/1971 ? ?Transition of Care (TOC) CM/SW Contact:    ?Sherie Don, LCSW ?Phone Number: ?01/02/2022, 11:07 AM ? ?Transition of Care Department [facility]) has reviewed patient and no TOC needs have been identified at this time. We will continue to monitor patient advancement through interdisciplinary progression rounds. If new patient transition needs arise, please place a TOC consult. ?

## 2022-01-03 ENCOUNTER — Inpatient Hospital Stay (HOSPITAL_COMMUNITY): Payer: BC Managed Care – PPO

## 2022-01-03 DIAGNOSIS — K56609 Unspecified intestinal obstruction, unspecified as to partial versus complete obstruction: Secondary | ICD-10-CM | POA: Diagnosis not present

## 2022-01-03 LAB — BASIC METABOLIC PANEL
Anion gap: 8 (ref 5–15)
BUN: 24 mg/dL — ABNORMAL HIGH (ref 6–20)
CO2: 20 mmol/L — ABNORMAL LOW (ref 22–32)
Calcium: 8.8 mg/dL — ABNORMAL LOW (ref 8.9–10.3)
Chloride: 112 mmol/L — ABNORMAL HIGH (ref 98–111)
Creatinine, Ser: 0.81 mg/dL (ref 0.44–1.00)
GFR, Estimated: >60 mL/min (ref >60)
Glucose, Bld: 98 mg/dL (ref 70–99)
Potassium: 3.5 mmol/L (ref 3.5–5.1)
Sodium: 140 mmol/L (ref 135–145)

## 2022-01-03 MED ORDER — PANTOPRAZOLE SODIUM 40 MG IV SOLR
40.0000 mg | INTRAVENOUS | Status: DC
  Administered 2022-01-04 – 2022-01-08 (×5): 40 mg via INTRAVENOUS
  Filled 2022-01-03 (×5): qty 10

## 2022-01-03 MED ORDER — BISACODYL 10 MG RE SUPP
10.0000 mg | Freq: Once | RECTAL | Status: AC
  Administered 2022-01-03: 10 mg via RECTAL
  Filled 2022-01-03: qty 1

## 2022-01-03 NOTE — Progress Notes (Signed)
?   01/03/22 1258  ?Assess: MEWS Score  ?Temp 100.3 ?F (37.9 ?C)  ?BP (!) 141/78  ?Pulse Rate (!) 123  ?Resp 18  ?SpO2 95 %  ?O2 Device Room Air  ?Assess: MEWS Score  ?MEWS Temp 0  ?MEWS Systolic 0  ?MEWS Pulse 2  ?MEWS RR 0  ?MEWS LOC 0  ?MEWS Score 2  ?MEWS Score Color Yellow  ?Assess: SIRS CRITERIA  ?SIRS Temperature  0  ?SIRS Pulse 1  ?SIRS Respirations  0  ?SIRS WBC 0  ?SIRS Score Sum  1  ? ? ?

## 2022-01-03 NOTE — Progress Notes (Signed)
? ?Progress Note ? ?   ?Subjective: ?Hernia remains unable to reduce. Still having some pain intermittently. No output from stoma since midnight 4/25. NGT in place and functioning. Patient has been ambulating.  ? ?Objective: ?Vital signs in last 24 hours: ?Temp:  [98.1 ?F (36.7 ?C)-99.7 ?F (37.6 ?C)] 99.7 ?F (37.6 ?C) (04/27 0530) ?Pulse Rate:  [104-118] 108 (04/27 0624) ?Resp:  [15-18] 17 (04/27 0530) ?BP: (126-148)/(67-86) 148/74 (04/27 2111) ?SpO2:  [95 %-97 %] 95 % (04/27 0624) ?Last BM Date : 01/01/22 ? ?Intake/Output from previous day: ?04/26 0701 - 04/27 0700 ?In: 2470.4 [P.O.:30; I.V.:2390.4; IV Piggyback:50] ?Out: 2650 [Urine:1900; Emesis/NG output:750] ?Intake/Output this shift: ?Total I/O ?In: -  ?Out: 300 [Urine:200; Emesis/NG output:100] ? ?PE: ?General: pleasant, WD, obese female who is laying in bed in NAD ?Lungs: respiratory effort nonlabored ?Abd: soft, mild ttp around parastomal hernia, no peritonitis, hernia is softer but does not feel fully reducible, nothing in ostomy pouch this AM, NGT with bilious fluid  ?Psych: A&Ox3 with an appropriate affect.  ? ? ?Lab Results:  ?Recent Labs  ?  01/01/22 ?0750 01/02/22 ?7356  ?WBC 12.9* 6.9  ?HGB 14.3 13.1  ?HCT 43.7 40.9  ?PLT 463* 440*  ? ?BMET ?Recent Labs  ?  01/02/22 ?7014 01/03/22 ?0402  ?NA 142 140  ?K 3.8 3.5  ?CL 112* 112*  ?CO2 21* 20*  ?GLUCOSE 147* 98  ?BUN 32* 24*  ?CREATININE 1.09* 0.81  ?CALCIUM 9.2 8.8*  ? ?PT/INR ?No results for input(s): LABPROT, INR in the last 72 hours. ?CMP  ?   ?Component Value Date/Time  ? NA 140 01/03/2022 0402  ? NA 138 05/20/2019 1527  ? K 3.5 01/03/2022 0402  ? CL 112 (H) 01/03/2022 0402  ? CO2 20 (L) 01/03/2022 0402  ? GLUCOSE 98 01/03/2022 0402  ? BUN 24 (H) 01/03/2022 0402  ? BUN 9 05/20/2019 1527  ? CREATININE 0.81 01/03/2022 0402  ? CALCIUM 8.8 (L) 01/03/2022 0402  ? PROT 8.1 01/01/2022 0750  ? PROT 6.7 05/20/2019 1527  ? ALBUMIN 4.3 01/01/2022 0750  ? ALBUMIN 3.9 05/20/2019 1527  ? AST 22 01/01/2022 0750  ?  ALT 18 01/01/2022 0750  ? ALKPHOS 68 01/01/2022 0750  ? BILITOT 0.6 01/01/2022 0750  ? BILITOT <0.2 05/20/2019 1527  ? GFRNONAA >60 01/03/2022 0402  ? GFRAA 91 05/20/2019 1527  ? ?Lipase  ?   ?Component Value Date/Time  ? LIPASE 37 01/01/2022 0750  ? ? ? ? ? ?Studies/Results: ?DG Abd 1 View ? ?Result Date: 01/01/2022 ?CLINICAL DATA:  Evaluate NG placement. EXAM: ABDOMEN - 1 VIEW COMPARISON:  01/01/2022. FINDINGS: The bowel gas pattern is normal, the mid to lower abdomen is obscured from the field of view. An enteric tube terminates in the stomach and appears appropriate position. Multiple calcifications are noted of the right upper quadrant, compatible with cholelithiasis. IMPRESSION: 1. The enteric tube terminates in the stomach and is appropriate in position. 2. Nonobstructive bowel-gas pattern, however the mid to lower abdomen is obscured from the field of view. 3. Cholelithiasis. Electronically Signed   By: Thornell Sartorius M.D.   On: 01/01/2022 19:59  ? ?DG Abdomen 1 View ? ?Result Date: 01/01/2022 ?CLINICAL DATA:  NG tube placement EXAM: ABDOMEN - 1 VIEW COMPARISON:  None. FINDINGS: Esophagogastric tube with tip below the diaphragm, side port level gastroesophageal junction. Nonobstructive pattern of included bowel gas. IMPRESSION: Esophagogastric tube with tip below the diaphragm, side port level gastroesophageal junction. Recommend advancement to ensure subdiaphragmatic  position of both tip and side port. Electronically Signed   By: Jearld Lesch M.D.   On: 01/01/2022 11:33  ? ?DG Abd Portable 1V ? ?Result Date: 01/03/2022 ?CLINICAL DATA:  Small-bowel obstruction, parastomal hernia EXAM: PORTABLE ABDOMEN - 1 VIEW COMPARISON:  KUB 01/01/2022 FINDINGS: The enteric catheter tip is in stable position projecting over the expected location of the distal stomach/proximal duodenum. There is gaseous distention of small bowel in the left upper quadrant measuring up to 4.3 cm suggesting persistent obstruction. There is an  overall paucity of bowel gas in the lower abdomen. There is no definite free intraperitoneal air, within the confines of supine technique. Gallstones are noted. The bones are stable. IMPRESSION: 1. Gaseous distention of bowel in the left upper quadrant measuring up to 4.3 cm suggesting persistent obstruction. 2. Enteric catheter in stable position terminating in the distal stomach/proximal duodenum. Electronically Signed   By: Lesia Hausen M.D.   On: 01/03/2022 07:51   ? ?Anti-infectives: ?Anti-infectives (From admission, onward)  ? ? None  ? ?  ? ? ? ?Assessment/Plan ? Parastomal hernia s/p laparoscopic repair 11/13/2021 by Dr. Michaell Cowing ?Recurrent parastomal hernia with SBO ?- CT w/ SBO secondary to parastomal hernia. Small amount of fluid within hernia sac without free air or pneumatosis ?- KUB this AM with dilated loops of small bowel still evident  ?- pt with no further output, will likely need to consider OR tomorrow for reduction of parastomal hernia with repair as able - will discuss with MD ?- Continue NGT for decompression  ?- ok to try stomal suppository this AM ?- Keep K > 4 and Mg > 2 for bowel function ?- Mobilize for bowel function ?  ?  ?FEN: NPO/NGT, IVF @ 157ml/hr ?ID: none indicated from surgical perspective ?VTE: lovenox   ? LOS: 2 days  ? ? ? ?Juliet Rude, PA-C ?[facility] ?01/03/2022, 10:43 AM ?Please see Amion for pager number during day hours 7:00am-4:30pm ? ?

## 2022-01-03 NOTE — Progress Notes (Signed)
?   01/03/22 1300  ?Assess: if the MEWS score is Yellow or Red  ?Were vital signs taken at a resting state? Yes  ?Focused Assessment No change from prior assessment  ?Does the patient meet 2 or more of the SIRS criteria? No  ?MEWS guidelines implemented *See Row Information* Yes  ?Escalate  ?MEWS: Escalate Yellow: discuss with charge nurse/RN and consider discussing with provider and RRT  ?Notify: Charge Nurse/RN  ?Name of Charge Nurse/RN Notified Sherolyn Buba  ?Date Charge Nurse/RN Notified 01/03/22  ?Time Charge Nurse/RN Notified 1300  ?Document  ?Patient Outcome Stabilized after interventions  ?Progress note created (see row info) Yes  ? ? ?

## 2022-01-03 NOTE — Progress Notes (Signed)
?PROGRESS NOTE ? ?Lindsay Diaz  YDX:[id number] DOB: 10-14-1970 DOA: 01/01/2022 ?PCP: [facility]  ? ?Brief Narrative: ? ?Patient is a 51 year old female with history of ulcerative colitis status post colostomy, recent SBO with parastomal hernia, bowel obstruction, hypertension, anxiety/depression who presented with recurrent abdomen, nausea, vomiting.  CT abdomen/pelvis on presentation showed SBO secondary to parastomal hernia.  Patient admitted for the management of SBO.  General surgery following. ? ?Assessment & Plan: ? ?Principal Problem: ?  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 ? ? ?Recurrent SBO: Presented with abdomen pain, nausea, vomiting.  Imaging showed SBO on presentation.  NG tube placed.  On  IV fluids, supportive care.  General surgery following.  SBO is most likely secondary to adhesions.  Complains of abdominal pain today, not passing gas.  General surgery considering laparotomy. ? ?Suspected aspiration pneumonia: Currently respiratory status stable, on room air.  No signs and symptoms of pneumonia. ? ?Leukocytosis: Most likely reactive. resolved ? ?Hypertension: Blood pressure stable.  Continue as needed medications for severe hypertension.  Also remains in sinus tachycardia most likely secondary to pain, vomiting ? ?Anxiety/depression: On bupropion, citalopram at home ? ?Morbid obesity: BMI of 40.7 ?  ? ? ?  ?  ? ?DVT prophylaxis:enoxaparin (LOVENOX) injection 40 mg Start: 01/01/22 2200 ? ? ?  Code Status: Full Code ? ?Family Communication: Family at bedside on 4/26 ? ?Patient status:Inpatient ? ?Patient is from :Home ? ?Anticipated discharge HM:CNOB ? ?Estimated DC date: Not sure at this point.  May need surgery ? ? ?Consultants: Surgery ? ?Procedures:None ? ?Antimicrobials:  ?Anti-infectives (From admission, onward)  ? ? None  ? ?  ? ? ?Subjective: ? ?Patient seen and examined at the bedside this morning.  Complains of increased abdominal  discomfort today, nauseous and vomiting.  Denies passing any gas ? ?Objective: ?Vitals:  ? 01/02/22 2029 01/02/22 2035 01/03/22 0530 01/03/22 0624  ?BP: 126/75  (!) 148/67 (!) 148/74  ?Pulse: (!) 118 (!) 104 (!) 115 (!) 108  ?Resp: 18  17   ?Temp: 98.6 ?F (37 ?C)  99.7 ?F (37.6 ?C)   ?TempSrc:   Oral   ?SpO2: 95%  97% 95%  ?Weight:      ?Height:      ? ? ?Intake/Output Summary (Last 24 hours) at 01/03/2022 1023 ?Last data filed at 01/03/2022 1000 ?Gross per 24 hour  ?Intake 2287.52 ml  ?Output 2150 ml  ?Net 137.52 ml  ? ?Filed Weights  ? 01/01/22 0739  ?Weight: 117.9 kg  ? ? ?Examination: ? ?General exam: Overall comfortable, not in distress,obese ?HEENT: PERRL ?Respiratory system:  no wheezes or crackles  ?Cardiovascular system: S1 & S2 heard, RRR.  ?Gastrointestinal system: Abdomen is mildly distended, soft .  No significant tenderness.  Slow bowel sounds heard colostomy ?Central nervous system: Alert and oriented ?Extremities: No edema, no clubbing ,no cyanosis ?Skin: No rashes, no ulcers,no icterus   ? ? ?Data Reviewed: I have personally reviewed following labs and imaging studies ? ?CBC: ?Recent Labs  ?Lab 01/01/22 ?0750 01/02/22 ?0962  ?WBC 12.9* 6.9  ?HGB 14.3 13.1  ?HCT 43.7 40.9  ?MCV 86.5 86.8  ?PLT 463* 440*  ? ?Basic Metabolic Panel: ?Recent Labs  ?Lab 01/01/22 ?0750 01/02/22 ?8366 01/03/22 ?0402  ?NA 137 142 140  ?K 4.1 3.8 3.5  ?CL 106 112* 112*  ?CO2 19* 21* 20*  ?GLUCOSE 181* 147* 98  ?BUN 13 32* 24*  ?CREATININE 0.90 1.09* 0.81  ?CALCIUM 10.8* 9.2  8.8*  ? ? ? ?No results found for this or any previous visit (from the past 240 hour(s)).  ? ?Radiology Studies: ?DG Abd 1 View ? ?Result Date: 01/01/2022 ?CLINICAL DATA:  Evaluate NG placement. EXAM: ABDOMEN - 1 VIEW COMPARISON:  01/01/2022. FINDINGS: The bowel gas pattern is normal, the mid to lower abdomen is obscured from the field of view. An enteric tube terminates in the stomach and appears appropriate position. Multiple calcifications are noted of the  right upper quadrant, compatible with cholelithiasis. IMPRESSION: 1. The enteric tube terminates in the stomach and is appropriate in position. 2. Nonobstructive bowel-gas pattern, however the mid to lower abdomen is obscured from the field of view. 3. Cholelithiasis. Electronically Signed   By: Thornell Sartorius M.D.   On: 01/01/2022 19:59  ? ?DG Abdomen 1 View ? ?Result Date: 01/01/2022 ?CLINICAL DATA:  NG tube placement EXAM: ABDOMEN - 1 VIEW COMPARISON:  None. FINDINGS: Esophagogastric tube with tip below the diaphragm, side port level gastroesophageal junction. Nonobstructive pattern of included bowel gas. IMPRESSION: Esophagogastric tube with tip below the diaphragm, side port level gastroesophageal junction. Recommend advancement to ensure subdiaphragmatic position of both tip and side port. Electronically Signed   By: Jearld Lesch M.D.   On: 01/01/2022 11:33  ? ?DG Abd Portable 1V ? ?Result Date: 01/03/2022 ?CLINICAL DATA:  Small-bowel obstruction, parastomal hernia EXAM: PORTABLE ABDOMEN - 1 VIEW COMPARISON:  KUB 01/01/2022 FINDINGS: The enteric catheter tip is in stable position projecting over the expected location of the distal stomach/proximal duodenum. There is gaseous distention of small bowel in the left upper quadrant measuring up to 4.3 cm suggesting persistent obstruction. There is an overall paucity of bowel gas in the lower abdomen. There is no definite free intraperitoneal air, within the confines of supine technique. Gallstones are noted. The bones are stable. IMPRESSION: 1. Gaseous distention of bowel in the left upper quadrant measuring up to 4.3 cm suggesting persistent obstruction. 2. Enteric catheter in stable position terminating in the distal stomach/proximal duodenum. Electronically Signed   By: Lesia Hausen M.D.   On: 01/03/2022 07:51   ? ?Scheduled Meds: ? enoxaparin (LOVENOX) injection  40 mg Subcutaneous Q24H  ? pantoprazole (PROTONIX) IV  40 mg Intravenous Q24H  ? ?Continuous  Infusions: ? sodium chloride 100 mL/hr at 01/03/22 0410  ? promethazine (PHENERGAN) injection (IM or IVPB) 12.5 mg (01/03/22 0452)  ? ? ? LOS: 2 days  ? ?Burnadette Pop, MD ?[facility] ?P4/27/2023, 10:23 AM   ?

## 2022-01-04 ENCOUNTER — Inpatient Hospital Stay (HOSPITAL_COMMUNITY): Payer: BC Managed Care – PPO | Admitting: Certified Registered Nurse Anesthetist

## 2022-01-04 ENCOUNTER — Encounter (HOSPITAL_COMMUNITY)
Admission: EM | Disposition: A | Discharge status: Home / Self Care | Payer: Self-pay | Source: Home / Self Care | Attending: Internal Medicine

## 2022-01-04 ENCOUNTER — Encounter (HOSPITAL_COMMUNITY): Payer: Self-pay | Admitting: Internal Medicine

## 2022-01-04 ENCOUNTER — Other Ambulatory Visit: Payer: Self-pay

## 2022-01-04 DIAGNOSIS — K56609 Unspecified intestinal obstruction, unspecified as to partial versus complete obstruction: Secondary | ICD-10-CM | POA: Diagnosis not present

## 2022-01-04 HISTORY — PX: PARASTOMAL HERNIA REPAIR: SHX2162

## 2022-01-04 LAB — BASIC METABOLIC PANEL
Anion gap: 7 (ref 5–15)
BUN: 14 mg/dL (ref 6–20)
CO2: 20 mmol/L — ABNORMAL LOW (ref 22–32)
Calcium: 8.4 mg/dL — ABNORMAL LOW (ref 8.9–10.3)
Chloride: 112 mmol/L — ABNORMAL HIGH (ref 98–111)
Creatinine, Ser: 0.67 mg/dL (ref 0.44–1.00)
GFR, Estimated: >60 mL/min (ref >60)
Glucose, Bld: 118 mg/dL — ABNORMAL HIGH (ref 70–99)
Potassium: 3.1 mmol/L — ABNORMAL LOW (ref 3.5–5.1)
Sodium: 139 mmol/L (ref 135–145)

## 2022-01-04 LAB — SURGICAL PCR SCREEN
MRSA, PCR: NEGATIVE
Staphylococcus aureus: NEGATIVE

## 2022-01-04 LAB — TYPE AND SCREEN
ABO/RH(D): O POS
Antibody Screen: POSITIVE

## 2022-01-04 LAB — ABO/RH: ABO/RH(D): O POS

## 2022-01-04 SURGERY — REPAIR, HERNIA, PARASTOMAL
Anesthesia: General | Site: Abdomen

## 2022-01-04 MED ORDER — DIPHENHYDRAMINE HCL 50 MG/ML IJ SOLN
INTRAMUSCULAR | Status: AC
  Filled 2022-01-04: qty 1

## 2022-01-04 MED ORDER — SUGAMMADEX SODIUM 500 MG/5ML IV SOLN
INTRAVENOUS | Status: AC
  Filled 2022-01-04: qty 5

## 2022-01-04 MED ORDER — MIDAZOLAM HCL 2 MG/2ML IJ SOLN
INTRAMUSCULAR | Status: AC
  Filled 2022-01-04: qty 2

## 2022-01-04 MED ORDER — CEFAZOLIN SODIUM-DEXTROSE 2-4 GM/100ML-% IV SOLN
INTRAVENOUS | Status: AC
  Filled 2022-01-04: qty 100

## 2022-01-04 MED ORDER — SUCCINYLCHOLINE CHLORIDE 200 MG/10ML IV SOSY
PREFILLED_SYRINGE | INTRAVENOUS | Status: AC
Start: 2022-01-04 — End: ?
  Filled 2022-01-04: qty 10

## 2022-01-04 MED ORDER — SCOPOLAMINE 1 MG/3DAYS TD PT72
MEDICATED_PATCH | TRANSDERMAL | Status: AC
  Filled 2022-01-04: qty 1

## 2022-01-04 MED ORDER — LACTATED RINGERS IV SOLN: INTRAVENOUS | Status: DC

## 2022-01-04 MED ORDER — FENTANYL CITRATE (PF) 100 MCG/2ML IJ SOLN
INTRAMUSCULAR | Status: DC | PRN
  Administered 2022-01-04 (×5): 50 ug via INTRAVENOUS
  Administered 2022-01-04: 100 ug via INTRAVENOUS

## 2022-01-04 MED ORDER — LIDOCAINE HCL (PF) 2 % IJ SOLN
INTRAMUSCULAR | Status: DC | PRN
  Administered 2022-01-04: 1.5 mg/kg/h via INTRADERMAL

## 2022-01-04 MED ORDER — LIDOCAINE HCL (PF) 2 % IJ SOLN
INTRAMUSCULAR | Status: AC
  Filled 2022-01-04: qty 5

## 2022-01-04 MED ORDER — DEXAMETHASONE SODIUM PHOSPHATE 4 MG/ML IJ SOLN
INTRAMUSCULAR | Status: DC | PRN
  Administered 2022-01-04: 5 mg via INTRAVENOUS

## 2022-01-04 MED ORDER — CEFAZOLIN SODIUM-DEXTROSE 2-4 GM/100ML-% IV SOLN
2.0000 g | Freq: Once | INTRAVENOUS | Status: AC
  Administered 2022-01-04: 2 g via INTRAVENOUS

## 2022-01-04 MED ORDER — CEFAZOLIN SODIUM-DEXTROSE 2-3 GM-%(50ML) IV SOLR
INTRAVENOUS | Status: DC | PRN
  Administered 2022-01-04: 2 g via INTRAVENOUS

## 2022-01-04 MED ORDER — ORAL CARE MOUTH RINSE: 15.0000 mL | Freq: Once | OROMUCOSAL | Status: AC

## 2022-01-04 MED ORDER — BUPIVACAINE-EPINEPHRINE (PF) 0.25% -1:200000 IJ SOLN
INTRAMUSCULAR | Status: AC
  Filled 2022-01-04: qty 30

## 2022-01-04 MED ORDER — FENTANYL CITRATE PF 50 MCG/ML IJ SOSY: 25.0000 ug | PREFILLED_SYRINGE | INTRAMUSCULAR | Status: DC | PRN

## 2022-01-04 MED ORDER — SUCCINYLCHOLINE CHLORIDE 200 MG/10ML IV SOSY
PREFILLED_SYRINGE | INTRAVENOUS | Status: DC | PRN
  Administered 2022-01-04: 140 mg via INTRAVENOUS

## 2022-01-04 MED ORDER — MIDAZOLAM HCL 5 MG/5ML IJ SOLN
INTRAMUSCULAR | Status: DC | PRN
  Administered 2022-01-04: 2 mg via INTRAVENOUS

## 2022-01-04 MED ORDER — METHOCARBAMOL 1000 MG/10ML IJ SOLN
500.0000 mg | Freq: Four times a day (QID) | INTRAVENOUS | Status: DC | PRN
  Filled 2022-01-04: qty 5

## 2022-01-04 MED ORDER — ONDANSETRON HCL 4 MG/2ML IJ SOLN
INTRAMUSCULAR | Status: AC
  Filled 2022-01-04: qty 2

## 2022-01-04 MED ORDER — ROCURONIUM BROMIDE 10 MG/ML (PF) SYRINGE
PREFILLED_SYRINGE | INTRAVENOUS | Status: DC | PRN
  Administered 2022-01-04: 70 mg via INTRAVENOUS

## 2022-01-04 MED ORDER — ACETAMINOPHEN 10 MG/ML IV SOLN
INTRAVENOUS | Status: AC
  Filled 2022-01-04: qty 100

## 2022-01-04 MED ORDER — PROPOFOL 10 MG/ML IV BOLUS
INTRAVENOUS | Status: DC | PRN
  Administered 2022-01-04: 160 mg via INTRAVENOUS

## 2022-01-04 MED ORDER — SCOPOLAMINE 1 MG/3DAYS TD PT72
MEDICATED_PATCH | TRANSDERMAL | Status: DC | PRN
  Administered 2022-01-04: 1 via TRANSDERMAL

## 2022-01-04 MED ORDER — SUGAMMADEX SODIUM 500 MG/5ML IV SOLN
INTRAVENOUS | Status: DC | PRN
  Administered 2022-01-04: 240 mg via INTRAVENOUS

## 2022-01-04 MED ORDER — FENTANYL CITRATE (PF) 250 MCG/5ML IJ SOLN
INTRAMUSCULAR | Status: AC
  Filled 2022-01-04: qty 5

## 2022-01-04 MED ORDER — PROMETHAZINE HCL 25 MG/ML IJ SOLN: 12.5000 mg | Freq: Once | INTRAMUSCULAR | Status: DC | PRN

## 2022-01-04 MED ORDER — DIPHENHYDRAMINE HCL 50 MG/ML IJ SOLN
INTRAMUSCULAR | Status: DC | PRN
  Administered 2022-01-04: 12.5 mg via INTRAVENOUS

## 2022-01-04 MED ORDER — LIDOCAINE HCL 2 % IJ SOLN
INTRAMUSCULAR | Status: AC
  Filled 2022-01-04: qty 20

## 2022-01-04 MED ORDER — PROPOFOL 10 MG/ML IV BOLUS
INTRAVENOUS | Status: AC
  Filled 2022-01-04: qty 20

## 2022-01-04 MED ORDER — ONDANSETRON HCL 4 MG/2ML IJ SOLN
INTRAMUSCULAR | Status: DC | PRN
  Administered 2022-01-04: 4 mg via INTRAVENOUS

## 2022-01-04 MED ORDER — 0.9 % SODIUM CHLORIDE (POUR BTL) OPTIME
TOPICAL | Status: DC | PRN
  Administered 2022-01-04: 2000 mL

## 2022-01-04 MED ORDER — ACETAMINOPHEN 10 MG/ML IV SOLN
INTRAVENOUS | Status: DC | PRN
  Administered 2022-01-04: 1000 mg via INTRAVENOUS

## 2022-01-04 MED ORDER — ACETAMINOPHEN 10 MG/ML IV SOLN
1000.0000 mg | Freq: Four times a day (QID) | INTRAVENOUS | Status: AC
Start: 2022-01-04 — End: 2022-01-05
  Administered 2022-01-04 – 2022-01-05 (×4): 1000 mg via INTRAVENOUS
  Filled 2022-01-04 (×4): qty 100

## 2022-01-04 MED ORDER — CHLORHEXIDINE GLUCONATE CLOTH 2 % EX PADS
6.0000 | MEDICATED_PAD | Freq: Every day | CUTANEOUS | Status: DC
  Administered 2022-01-04 – 2022-01-08 (×5): 6 via TOPICAL

## 2022-01-04 MED ORDER — ROCURONIUM BROMIDE 10 MG/ML (PF) SYRINGE
PREFILLED_SYRINGE | INTRAVENOUS | Status: AC
  Filled 2022-01-04: qty 10

## 2022-01-04 MED ORDER — POTASSIUM CHLORIDE 10 MEQ/100ML IV SOLN
10.0000 meq | INTRAVENOUS | Status: AC
  Administered 2022-01-04 (×5): 10 meq via INTRAVENOUS
  Filled 2022-01-04 (×2): qty 100

## 2022-01-04 MED ORDER — LIDOCAINE 2% (20 MG/ML) 5 ML SYRINGE
INTRAMUSCULAR | Status: DC | PRN
Start: 2022-01-04 — End: 2022-01-04
  Administered 2022-01-04: 100 mg via INTRAVENOUS

## 2022-01-04 MED ORDER — FENTANYL CITRATE (PF) 100 MCG/2ML IJ SOLN
INTRAMUSCULAR | Status: AC
  Filled 2022-01-04: qty 2

## 2022-01-04 MED ORDER — DEXAMETHASONE SODIUM PHOSPHATE 10 MG/ML IJ SOLN
INTRAMUSCULAR | Status: AC
  Filled 2022-01-04: qty 1

## 2022-01-04 MED ORDER — CHLORHEXIDINE GLUCONATE 0.12 % MT SOLN: 15.0000 mL | Freq: Once | OROMUCOSAL | Status: AC

## 2022-01-04 SURGICAL SUPPLY — 41 items
BAG COUNTER SPONGE SURGICOUNT (BAG) IMPLANT
BINDER ABDOMINAL 12 ML 46-62 (SOFTGOODS) IMPLANT
CHLORAPREP W/TINT 26 (MISCELLANEOUS) ×4 IMPLANT
COVER MAYO STAND STRL (DRAPES) ×1 IMPLANT
COVER SURGICAL LIGHT HANDLE (MISCELLANEOUS) ×2 IMPLANT
DRAPE LAPAROSCOPIC ABDOMINAL (DRAPES) ×2 IMPLANT
DRAPE WARM FLUID 44X44 (DRAPES) ×1 IMPLANT
DRSG OPSITE POSTOP 4X12 (GAUZE/BANDAGES/DRESSINGS) ×1 IMPLANT
DRSG TEGADERM 4X4.75 (GAUZE/BANDAGES/DRESSINGS) ×1 IMPLANT
ELECT REM PT RETURN 15FT ADLT (MISCELLANEOUS) ×2 IMPLANT
GAUZE 4X4 16PLY ~~LOC~~+RFID DBL (SPONGE) ×1 IMPLANT
GAUZE SPONGE 4X4 12PLY STRL (GAUZE/BANDAGES/DRESSINGS) ×1 IMPLANT
GLOVE SURG ORTHO 8.0 STRL STRW (GLOVE) ×2 IMPLANT
GLOVE SURG SYN 7.5  E (GLOVE) ×1
GLOVE SURG SYN 7.5 E (GLOVE) ×1 IMPLANT
GLOVE SURG SYN 7.5 PF PI (GLOVE) ×1 IMPLANT
GOWN STRL REUS W/ TWL XL LVL3 (GOWN DISPOSABLE) ×2 IMPLANT
GOWN STRL REUS W/TWL XL LVL3 (GOWN DISPOSABLE) ×2
HANDLE SUCTION POOLE (INSTRUMENTS) IMPLANT
KIT BASIN OR (CUSTOM PROCEDURE TRAY) ×2 IMPLANT
KIT TURNOVER KIT A (KITS) IMPLANT
MARKER SKIN DUAL TIP RULER LAB (MISCELLANEOUS) ×1 IMPLANT
PACK GENERAL/GYN (CUSTOM PROCEDURE TRAY) ×2 IMPLANT
SOL PREP POV-IOD 4OZ 10% (MISCELLANEOUS) ×1 IMPLANT
STAPLER VISISTAT (STAPLE) ×1 IMPLANT
STAPLER VISISTAT 35W (STAPLE) ×1 IMPLANT
SUCTION POOLE HANDLE (INSTRUMENTS) ×2
SUT NOVA 1 T20/GS 25DT (SUTURE) ×7 IMPLANT
SUT NOVA NAB GS-21 0 18 T12 DT (SUTURE) IMPLANT
SUT PDS AB 1 CTX 36 (SUTURE) IMPLANT
SUT SILK 2 0 (SUTURE) ×1
SUT SILK 2 0 SH CR/8 (SUTURE) ×1 IMPLANT
SUT SILK 2-0 18XBRD TIE 12 (SUTURE) IMPLANT
SUT SILK 3 0 (SUTURE) ×1
SUT SILK 3 0 SH CR/8 (SUTURE) ×1 IMPLANT
SUT SILK 3-0 18XBRD TIE 12 (SUTURE) IMPLANT
SUT VIC AB 2-0 CT2 27 (SUTURE) IMPLANT
TOWEL OR 17X26 10 PK STRL BLUE (TOWEL DISPOSABLE) ×2 IMPLANT
TRAY FOLEY MTR SLVR 14FR STAT (SET/KITS/TRAYS/PACK) ×1 IMPLANT
TRAY FOLEY MTR SLVR 16FR STAT (SET/KITS/TRAYS/PACK) ×1 IMPLANT
YANKAUER SUCT BULB TIP 10FT TU (MISCELLANEOUS) ×1 IMPLANT

## 2022-01-04 NOTE — Anesthesia Procedure Notes (Signed)
Procedure Name: Intubation ?Date/Time: 01/04/2022 2:00 PM ?Performed by: Vanessa [place]em[place], CRNA ?Pre-anesthesia Checklist: Emergency Drugs available, Suction available, Patient identified and Patient being monitored ?Patient Re-evaluated:Patient Re-evaluated prior to induction ?Oxygen Delivery Method: Circle system utilized ?Preoxygenation: Pre-oxygenation with 100% oxygen ?Induction Type: IV induction, Cricoid Pressure applied and Rapid sequence ?Ventilation: Mask ventilation without difficulty ?Laryngoscope Size: Glidescope and 3 ?Grade View: Grade I ?Tube type: Oral ?Tube size: 7.0 mm ?Number of attempts: 1 ?Airway Equipment and Method: Video-laryngoscopy ?Placement Confirmation: ETT inserted through vocal cords under direct vision, positive ETCO2 and breath sounds checked- equal and bilateral ?Secured at: 22 cm ?Tube secured with: Tape ?Dental Injury: Teeth and Oropharynx as per pre-operative assessment  ? ? ? ? ?

## 2022-01-04 NOTE — Anesthesia Preprocedure Evaluation (Addendum)
Anesthesia Evaluation  ?Patient identified by MRN, date of birth, ID band ?Patient awake ? ? ? ?Reviewed: ?Allergy & Precautions, NPO status , Patient's Chart, lab work & pertinent test results ? ?Airway ?Mallampati: III ? ?TM Distance: >3 FB ?Neck ROM: Full ? ? ? Dental ? ?(+) Dental Advisory Given, Teeth Intact ?  ?Pulmonary ?neg pulmonary ROS,  ?  ?Pulmonary exam normal ?breath sounds clear to auscultation ? ? ? ? ? ? Cardiovascular ?hypertension, Pt. on home beta blockers and Pt. on medications ?Normal cardiovascular exam ?Rhythm:Regular Rate:Normal ? ? ?  ?Neuro/Psych ?PSYCHIATRIC DISORDERS Depression negative neurological ROS ?   ? GI/Hepatic ?Neg liver ROS, PUD, H/o UC ?  ?Endo/Other  ?Morbid obesity (BMI 41) ? Renal/GU ?negative Renal ROS  ?negative genitourinary ?  ?Musculoskeletal ?negative musculoskeletal ROS ?(+)  ? Abdominal ?(+) + obese,   ?Peds ? Hematology ?negative hematology ROS ?(+)   ?Anesthesia Other Findings ?SBO from parastomal hernia ? Reproductive/Obstetrics ? ?  ? ? ? ? ? ? ? ? ? ? ? ? ? ?  ?  ? ? ? ? ? ? ? ?Anesthesia Physical ? ?Anesthesia Plan ? ?ASA: 3 ? ?Anesthesia Plan: General  ? ?Post-op Pain Management: Ofirmev IV (intra-op)* and Lidocaine infusion*  ? ?Induction: Intravenous, Cricoid pressure planned and Rapid sequence ? ?PONV Risk Score and Plan: 4 or greater and Midazolam, Dexamethasone, Treatment may vary due to age or medical condition, Diphenhydramine and Scopolamine patch - Pre-op ? ?Airway Management Planned: Oral ETT and Video Laryngoscope Planned ? ?Additional Equipment: None ? ?Intra-op Plan:  ? ?Post-operative Plan: Extubation in OR ? ?Informed Consent: I have reviewed the patients History and Physical, chart, labs and discussed the procedure including the risks, benefits and alternatives for the proposed anesthesia with the patient or authorized representative who has indicated his/her understanding and acceptance.  ? ? ? ?Dental  advisory given ? ?Plan Discussed with: CRNA ? ?Anesthesia Plan Comments:   ? ? ? ? ? ?Anesthesia Quick Evaluation ? ?

## 2022-01-04 NOTE — Anesthesia Postprocedure Evaluation (Signed)
Anesthesia Post Note ? ?Patient: Lindsay Diaz ? ?Procedure(s) Performed: EXPLORATORY LAPAROTOMY, HERNIA REPAIR PARASTOMAL (Abdomen) ? ?  ? ?Patient location during evaluation: PACU ?Anesthesia Type: General ?Level of consciousness: awake and alert ?Pain management: pain level controlled ?Vital Signs Assessment: post-procedure vital signs reviewed and stable ?Respiratory status: spontaneous breathing, nonlabored ventilation, respiratory function stable and patient connected to nasal cannula oxygen ?Cardiovascular status: blood pressure returned to baseline and stable ?Postop Assessment: no apparent nausea or vomiting ?Anesthetic complications: no ? ? ?No notable events documented. ? ?Last Vitals:  ?Vitals:  ? 01/04/22 1630 01/04/22 1650  ?BP: (!) 149/95 (!) 150/95  ?Pulse: 100 90  ?Resp: 15 16  ?Temp: 37.2 ?C 36.7 ?C  ?SpO2: 96% 96%  ?  ?Last Pain:  ?Vitals:  ? 01/04/22 1714  ?TempSrc:   ?PainSc: 8   ? ? ?  ?  ?  ?  ?  ?  ? ?Collene Schlichter ? ? ? ? ?

## 2022-01-04 NOTE — Progress Notes (Addendum)
?PROGRESS NOTE ? ?Lindsay Diaz  TTS:[id number] DOB: 05-21-1971 DOA: 01/01/2022 ?PCP: [facility]  ? ?Brief Narrative: ? ?Patient is a 51 year old female with history of ulcerative colitis status post colostomy, recent SBO with parastomal hernia, bowel obstruction, hypertension, anxiety/depression who presented with recurrent abdomen, nausea, vomiting.  CT abdomen/pelvis on presentation showed SBO secondary to parastomal hernia.  Patient admitted for the management of SBO.  General surgery following.  She did not improve with conservative management, general surgery planning for exploratory laparotomy today. ? ?Assessment & Plan: ? ?Principal Problem: ?  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 ?Active Problems: ?  HTN (hypertension) ?  Morbid obesity with body mass index (BMI) of 40.0 to 49.9 (HCC) ?  Hypokalemia ? ? ?Recurrent SBO: Presented with abdomen pain, nausea, vomiting.  Imaging showed SBO on presentation.  NG tube placed.  On  IV fluids, supportive care.  General surgery following.  SBO is most likely secondary to adhesions. She did not improve with conservative management, general surgery planning for exploratory laparotomy today. ? ?Suspected aspiration pneumonia: Currently respiratory status stable, on room air.  No signs and symptoms of pneumonia.  No indication for antibiotics ? ?Hypokalemia: Being supplemented and monitored ? ?Leukocytosis: Most likely reactive. resolved ? ?Hypertension: Blood pressure stable.  Continue as needed medications for severe hypertension.  Also remains in sinus tachycardia most likely secondary to pain, vomiting ? ?Anxiety/depression: On bupropion, citalopram at home ? ?Morbid obesity: BMI of 40.7 ?  ? ? ?  ?  ? ?DVT prophylaxis:enoxaparin (LOVENOX) injection 40 mg Start: 01/01/22 2200 ? ? ?  Code Status: Full Code ? ?Family Communication: Family at bedside on 4/26 ? ?Patient status:Inpatient ? ?Patient is from  :Home ? ?Anticipated discharge SP:QZRA ? ?Estimated DC date: Not sure at this point.  Waiting for surgery ? ? ?Consultants: Surgery ? ?Procedures:None ? ?Antimicrobials:  ?Anti-infectives (From admission, onward)  ? ? Start     Dose/Rate Route Frequency Ordered Stop  ? 01/04/22 0830  ceFAZolin (ANCEF) IVPB 2g/100 mL premix       ? 2 g ?200 mL/hr over 30 Minutes Intravenous  Once 01/04/22 0734 01/04/22 0940  ? ?  ? ? ?Subjective: ? ?Patient seen and examined at the bedside this morning.  Lying in bed.  Still having abdominal discomfort.  No passage of gas.  On NG tube ?Objective: ?Vitals:  ? 01/03/22 2152 01/04/22 0152 01/04/22 0230 01/04/22 0603  ?BP: (!) 127/91 (!) 155/83 (!) 144/89 133/79  ?Pulse: (!) 118 (!) 131 100 (!) 102  ?Resp: 18 18 18 18   ?Temp: 98.5 ?F (36.9 ?C) 97.7 ?F (36.5 ?C) 97.7 ?F (36.5 ?C) 98 ?F (36.7 ?C)  ?TempSrc:      ?SpO2: 97% 96% 96% 98%  ?Weight:      ?Height:      ? ? ?Intake/Output Summary (Last 24 hours) at 01/04/2022 1017 ?Last data filed at 01/04/2022 1000 ?Gross per 24 hour  ?Intake 1624.92 ml  ?Output 1950 ml  ?Net -325.08 ml  ? ?Filed Weights  ? 01/01/22 0739  ?Weight: 117.9 kg  ? ? ?Examination: ? ?General exam: Overall comfortable, not in distress,obese ?HEENT: PERRL,NG tube ?Respiratory system:  no wheezes or crackles  ?Cardiovascular system: S1 & S2 heard, RRR.  ?Gastrointestinal system: Abdomen is nondistended, soft and nontender.  Slow bowel sounds heard, empty colostomy ?Central nervous system: Alert and oriented ?Extremities: No edema, no clubbing ,no cyanosis ?Skin: No rashes, no ulcers,no icterus   ? ? ?  Data Reviewed: I have personally reviewed following labs and imaging studies ? ?CBC: ?Recent Labs  ?Lab 01/01/22 ?0750 01/02/22 ?8563  ?WBC 12.9* 6.9  ?HGB 14.3 13.1  ?HCT 43.7 40.9  ?MCV 86.5 86.8  ?PLT 463* 440*  ? ?Basic Metabolic Panel: ?Recent Labs  ?Lab 01/01/22 ?0750 01/02/22 ?1497 01/03/22 ?0402 01/04/22 ?0413  ?NA 137 142 140 139  ?K 4.1 3.8 3.5 3.1*  ?CL 106 112*  112* 112*  ?CO2 19* 21* 20* 20*  ?GLUCOSE 181* 147* 98 118*  ?BUN 13 32* 24* 14  ?CREATININE 0.90 1.09* 0.81 0.67  ?CALCIUM 10.8* 9.2 8.8* 8.4*  ? ? ? ?Recent Results (from the past 240 hour(s))  ?Surgical PCR screen     Status: None  ? Collection Time: 01/04/22  3:49 AM  ? Specimen: Nasal Mucosa; Nasal Swab  ?Result Value Ref Range Status  ? MRSA, PCR NEGATIVE NEGATIVE Final  ? Staphylococcus aureus NEGATIVE NEGATIVE Final  ?  Comment: (NOTE) ?The Xpert SA Assay (FDA approved for NASAL specimens in patients 57 ?years of age and older), is one component of a comprehensive ?surveillance program. It is not intended to diagnose infection nor to ?guide or monitor treatment. ?Performed at [facility], [address] ?[place], [place] [address] ?  ?  ? ?Radiology Studies: ?DG Abd Portable 1V ? ?Result Date: 01/03/2022 ?CLINICAL DATA:  Small-bowel obstruction, parastomal hernia EXAM: PORTABLE ABDOMEN - 1 VIEW COMPARISON:  KUB 01/01/2022 FINDINGS: The enteric catheter tip is in stable position projecting over the expected location of the distal stomach/proximal duodenum. There is gaseous distention of small bowel in the left upper quadrant measuring up to 4.3 cm suggesting persistent obstruction. There is an overall paucity of bowel gas in the lower abdomen. There is no definite free intraperitoneal air, within the confines of supine technique. Gallstones are noted. The bones are stable. IMPRESSION: 1. Gaseous distention of bowel in the left upper quadrant measuring up to 4.3 cm suggesting persistent obstruction. 2. Enteric catheter in stable position terminating in the distal stomach/proximal duodenum. Electronically Signed   By: Lesia Hausen M.D.   On: 01/03/2022 07:51   ? ?Scheduled Meds: ? enoxaparin (LOVENOX) injection  40 mg Subcutaneous Q24H  ? pantoprazole (PROTONIX) IV  40 mg Intravenous Q24H  ? ?Continuous Infusions: ? sodium chloride 100 mL/hr at 01/04/22 0143  ? potassium chloride 10 mEq  (01/04/22 0908)  ? promethazine (PHENERGAN) injection (IM or IVPB) 12.5 mg (01/03/22 0452)  ? ? ? LOS: 3 days  ? ?Burnadette Pop, MD ?[facility] ?P4/28/2023, 10:17 AM   ?

## 2022-01-04 NOTE — Progress Notes (Signed)
? ?Progress Note ? ?Day of Surgery  ?Subjective: ?Hernia remains unable to reduce. Still having some pain intermittently. No output from stoma since midnight 4/25. NGT in place and functioning. Patient has been ambulating. Plan for OR today and patient is willing to proceed.  ? ?Objective: ?Vital signs in last 24 hours: ?Temp:  [97.7 ?F (36.5 ?C)-100.3 ?F (37.9 ?C)] 98.3 ?F (36.8 ?C) (04/28 1019) ?Pulse Rate:  [100-131] 104 (04/28 1019) ?Resp:  [16-18] 16 (04/28 1019) ?BP: (127-155)/(75-93) 142/93 (04/28 1019) ?SpO2:  [95 %-99 %] 99 % (04/28 1019) ?Last BM Date : 01/01/22 ? ?Intake/Output from previous day: ?04/27 0701 - 04/28 0700 ?In: 2024.9 [P.O.:30; I.V.:1994.9] ?Out: 1800 [Urine:700; Emesis/NG output:1100] ?Intake/Output this shift: ?Total I/O ?In: 0  ?Out: 450 [Urine:300; Emesis/NG output:150] ? ?PE: ?General: pleasant, WD, obese female who is laying in bed in NAD ?Lungs: respiratory effort nonlabored ?Abd: soft, mild ttp around parastomal hernia, no peritonitis, hernia is softer but does not feel fully reducible, nothing in ostomy pouch this AM, NGT with bilious fluid  ?Psych: A&Ox3 with an appropriate affect.  ? ? ?Lab Results:  ?Recent Labs  ?  01/02/22 ?0447  ?WBC 6.9  ?HGB 13.1  ?HCT 40.9  ?PLT 440*  ? ? ?BMET ?Recent Labs  ?  01/03/22 ?0402 01/04/22 ?0413  ?NA 140 139  ?K 3.5 3.1*  ?CL 112* 112*  ?CO2 20* 20*  ?GLUCOSE 98 118*  ?BUN 24* 14  ?CREATININE 0.81 0.67  ?CALCIUM 8.8* 8.4*  ? ? ?PT/INR ?No results for input(s): LABPROT, INR in the last 72 hours. ?CMP  ?   ?Component Value Date/Time  ? NA 139 01/04/2022 0413  ? NA 138 05/20/2019 1527  ? K 3.1 (L) 01/04/2022 0413  ? CL 112 (H) 01/04/2022 0413  ? CO2 20 (L) 01/04/2022 0413  ? GLUCOSE 118 (H) 01/04/2022 0413  ? BUN 14 01/04/2022 0413  ? BUN 9 05/20/2019 1527  ? CREATININE 0.67 01/04/2022 0413  ? CALCIUM 8.4 (L) 01/04/2022 0413  ? PROT 8.1 01/01/2022 0750  ? PROT 6.7 05/20/2019 1527  ? ALBUMIN 4.3 01/01/2022 0750  ? ALBUMIN 3.9 05/20/2019 1527  ?  AST 22 01/01/2022 0750  ? ALT 18 01/01/2022 0750  ? ALKPHOS 68 01/01/2022 0750  ? BILITOT 0.6 01/01/2022 0750  ? BILITOT <0.2 05/20/2019 1527  ? GFRNONAA >60 01/04/2022 0413  ? GFRAA 91 05/20/2019 1527  ? ?Lipase  ?   ?Component Value Date/Time  ? LIPASE 37 01/01/2022 0750  ? ? ? ? ? ?Studies/Results: ?DG Abd Portable 1V ? ?Result Date: 01/03/2022 ?CLINICAL DATA:  Small-bowel obstruction, parastomal hernia EXAM: PORTABLE ABDOMEN - 1 VIEW COMPARISON:  KUB 01/01/2022 FINDINGS: The enteric catheter tip is in stable position projecting over the expected location of the distal stomach/proximal duodenum. There is gaseous distention of small bowel in the left upper quadrant measuring up to 4.3 cm suggesting persistent obstruction. There is an overall paucity of bowel gas in the lower abdomen. There is no definite free intraperitoneal air, within the confines of supine technique. Gallstones are noted. The bones are stable. IMPRESSION: 1. Gaseous distention of bowel in the left upper quadrant measuring up to 4.3 cm suggesting persistent obstruction. 2. Enteric catheter in stable position terminating in the distal stomach/proximal duodenum. Electronically Signed   By: Peter  Noone M.D.   On: 01/03/2022 07:51   ? ?Anti-infectives: ?Anti-infectives (From admission, onward)  ? ? Start     Dose/Rate Route Frequency Ordered Stop  ? 01/04/22 0830    ceFAZolin (ANCEF) IVPB 2g/100 mL premix       ? 2 g ?200 mL/hr over 30 Minutes Intravenous  Once 01/04/22 0734 01/04/22 0940  ? ?  ? ? ? ?Assessment/Plan ? Parastomal hernia s/p laparoscopic repair 11/13/2021 by Dr. Gross ?Recurrent parastomal hernia with SBO ?- CT w/ SBO secondary to parastomal hernia. Small amount of fluid within hernia sac without free air or pneumatosis ?- KUB this AM with dilated loops of small bowel still evident  ?- pt with no further output ?- OR today for ex-lap and reduction of parastomal hernia  ?  ?  ?FEN: NPO/NGT, IVF @ 125ml/hr ?ID: Ancef pre-op ?VTE: lovenox    ? LOS: 3 days  ? ? ? ?Mccartney Brucks R Yarianna Varble, PA-C ?[facility] ?01/04/2022, 11:31 AM ?Please see Amion for pager number during day hours 7:00am-4:30pm ? ?

## 2022-01-04 NOTE — Interval H&P Note (Signed)
History and Physical Interval Note: ? ?01/04/2022 ?1:26 PM ? ?Lindsay Diaz  has presented today for surgery, with the diagnosis of PARASTOMAL HERNIA.  The various methods of treatment have been discussed with the patient and family. After consideration of risks, benefits and other options for treatment, the patient has consented to  ? ? Procedure(s): ?HERNIA REPAIR PARASTOMAL (N/A) as a surgical intervention.   ? ?The patient's history has been reviewed, patient examined, no change in status, stable for surgery.  I have reviewed the patient's chart and labs.  Questions were answered to the patient's satisfaction.   ? ?[name], MD ?[facility] ?A [facility] practice ?Office: [phone] ? ? ?[name] ? ? ?

## 2022-01-04 NOTE — Op Note (Signed)
Operative Note ? ?Pre-operative Diagnosis:  small bowel obstruction, recurrent parastomal hernia ? ?Post-operative Diagnosis:  same ? ?Surgeon:  Darnell Level, MD ? ?Assistant:  Benna Dunks, MD ([facility] Resident), Leary Roca, PA-C  ? ?Procedure:  Exploratory laparotomy, primary repair parastomal hernia ? ?Anesthesia:  general ? ?Estimated Blood Loss:  minimal ? ?Drains: none ?        ?Specimen: none ? ?Indications: Patient is a 51 year old female known to our surgical service having undergone laparoscopic reduction of parastomal hernia with suture closure approximately 1 month ago.  Unfortunately the patient presented with small bowel obstruction and CT scan showing a loop of small bowel within the parastomal hernia as the point of transition.  Patient was managed with nasogastric decompression and intravenous hydration without improvement.  Patient now comes to the operating room for surgical repair. ? ?Procedure:  The patient was seen in the pre-op holding area. The risks, benefits, complications, treatment options, and expected outcomes were previously discussed with the patient. The patient agreed with the proposed plan and has signed the informed consent form.  The patient was brought to the operating room by the surgical team, identified as Conley Simmonds and the procedure verified. A "time out" was completed and the above information confirmed. ? ?Following administration of general anesthesia, the patient is positioned and then prepped and draped in the usual aseptic fashion.  After ascertaining that an adequate level of anesthesia been achieved, the patient's previous midline abdominal incision is reopened.  Dissection was carried through subcutaneous tissues using the electrocautery for hemostasis.  Fascia is incised in the midline.  There are multiple small hernia defects within the midline fascia.  Incision is extended cephalad and caudad.  Peritoneal cavity is entered. ? ?There are multiple dilated loops  of small bowel.  These are eviscerated.  Palpation around the site of the colostomy shows minimal adhesions.  There is a loop of small bowel which enters the abdominal wall lateral to the stoma.  With gentle traction this is reduced back within the peritoneal cavity.  The entire loop of small bowel is reduced from the hernia sac.  The bowel appears viable.  The point of obstruction is obviously noted. ? ?Contents of the small bowel are then propelled retrograde and evacuated with the nasogastric tube.  A large part of this is air and some fluid. ? ?Palpation at the site of the parastomal hernia shows a relatively open defect circumferentially.  Midline incision is extended slightly cephalad and caudad in order to provide for visualization.  Using #1 Novafil simple sutures the defect is closed superiorly, inferiorly, and medially around the existing stoma.  At this point only a fingertip can be introduced between the bowel and the fascial edges. ? ?Midline abdominal incision is then closed with interrupted #1 Novafil simple sutures.  Fascial edges are freed slightly at the site of herniation in order to allow complete closure.  Subcutaneous tissues were then irrigated and skin closed with stainless steel staples.  Wounds are washed and dried and a honeycomb dressing is applied to the midline incision.  Colostomy appliance is applied at the stoma. ? ?Patient is awakened from anesthesia and transported to the recovery room in stable condition.  The patient tolerated the procedure well. ? ? ?Darnell Level, MD ?[facility] ?Office: [phone] ?  ?

## 2022-01-04 NOTE — Progress Notes (Signed)
?   01/04/22 0152  ?Assess: MEWS Score  ?Temp 97.7 ?F (36.5 ?C)  ?BP (!) 155/83  ?Pulse Rate (!) 131  ?Resp 18  ?SpO2 96 %  ?O2 Device Room Air  ?Assess: MEWS Score  ?MEWS Temp 0  ?MEWS Systolic 0  ?MEWS Pulse 3  ?MEWS RR 0  ?MEWS LOC 0  ?MEWS Score 3  ?MEWS Score Color Yellow  ?Assess: if the MEWS score is Yellow or Red  ?Were vital signs taken at a resting state? Yes  ?Focused Assessment No change from prior assessment  ?Does the patient meet 2 or more of the SIRS criteria? No  ?MEWS guidelines implemented *See Row Information* Yes  ?Notify: Provider  ?Provider Name/Title J. Olena Heckle  ?Date Provider Notified 01/04/22  ?Time Provider Notified [phone]  ?Notification Type Page  ?Notification Reason Other (Comment) ?(notification)  ?Provider response Evaluate remotely  ?Assess: SIRS CRITERIA  ?SIRS Temperature  0  ?SIRS Pulse 1  ?SIRS Respirations  0  ?SIRS WBC 0  ?SIRS Score Sum  1  ? ?Lobetolol IV PRN given. HR went down to 100 and BP is now 144/89.  ?

## 2022-01-04 NOTE — Transfer of Care (Signed)
Immediate Anesthesia Transfer of Care Note ? ?Patient: Lindsay Diaz ? ?Procedure(s) Performed: EXPLORATORY LAPAROTOMY, HERNIA REPAIR PARASTOMAL (Abdomen) ? ?Patient Location: PACU ? ?Anesthesia Type:General ? ?Level of Consciousness: awake, alert  and oriented ? ?Airway & Oxygen Therapy: Patient Spontanous Breathing and Patient connected to face mask oxygen ? ?Post-op Assessment: Report given to RN, Post -op Vital signs reviewed and stable and Patient moving all extremities X 4 ? ?Post vital signs: Reviewed and stable ? ?Last Vitals:  ?Vitals Value Taken Time  ?BP 150/79 01/04/22 1604  ?Temp    ?Pulse 98 01/04/22 1606  ?Resp 17 01/04/22 1606  ?SpO2 98 % 01/04/22 1606  ?Vitals shown include unvalidated device data. ? ?Last Pain:  ?Vitals:  ? 01/04/22 1312  ?TempSrc:   ?PainSc: 5   ?   ? ?Patients Stated Pain Goal: 3 (01/04/22 1312) ? ?Complications: No notable events documented. ?

## 2022-01-04 NOTE — H&P (View-Only) (Signed)
? ?Progress Note ? ?Day of Surgery  ?Subjective: ?Hernia remains unable to reduce. Still having some pain intermittently. No output from stoma since midnight 4/25. NGT in place and functioning. Patient has been ambulating. Plan for OR today and patient is willing to proceed.  ? ?Objective: ?Vital signs in last 24 hours: ?Temp:  [97.7 ?F (36.5 ?C)-100.3 ?F (37.9 ?C)] 98.3 ?F (36.8 ?C) (04/28 1019) ?Pulse Rate:  [100-131] 104 (04/28 1019) ?Resp:  [16-18] 16 (04/28 1019) ?BP: (127-155)/(75-93) 142/93 (04/28 1019) ?SpO2:  [95 %-99 %] 99 % (04/28 1019) ?Last BM Date : 01/01/22 ? ?Intake/Output from previous day: ?04/27 0701 - 04/28 0700 ?In: 2024.9 [P.O.:30; I.V.:1994.9] ?Out: 1800 [Urine:700; Emesis/NG output:1100] ?Intake/Output this shift: ?Total I/O ?In: 0  ?Out: 450 [Urine:300; Emesis/NG output:150] ? ?PE: ?General: pleasant, WD, obese female who is laying in bed in NAD ?Lungs: respiratory effort nonlabored ?Abd: soft, mild ttp around parastomal hernia, no peritonitis, hernia is softer but does not feel fully reducible, nothing in ostomy pouch this AM, NGT with bilious fluid  ?Psych: A&Ox3 with an appropriate affect.  ? ? ?Lab Results:  ?Recent Labs  ?  01/02/22 ?[id number]  ?WBC 6.9  ?HGB 13.1  ?HCT 40.9  ?PLT 440*  ? ? ?BMET ?Recent Labs  ?  01/03/22 ?0402 01/04/22 ?0413  ?NA 140 139  ?K 3.5 3.1*  ?CL 112* 112*  ?CO2 20* 20*  ?GLUCOSE 98 118*  ?BUN 24* 14  ?CREATININE 0.81 0.67  ?CALCIUM 8.8* 8.4*  ? ? ?PT/INR ?No results for input(s): LABPROT, INR in the last 72 hours. ?CMP  ?   ?Component Value Date/Time  ? NA 139 01/04/2022 0413  ? NA 138 05/20/2019 1527  ? K 3.1 (L) 01/04/2022 0413  ? CL 112 (H) 01/04/2022 0413  ? CO2 20 (L) 01/04/2022 0413  ? GLUCOSE 118 (H) 01/04/2022 0413  ? BUN 14 01/04/2022 0413  ? BUN 9 05/20/2019 1527  ? CREATININE 0.67 01/04/2022 0413  ? CALCIUM 8.4 (L) 01/04/2022 0413  ? PROT 8.1 01/01/2022 0750  ? PROT 6.7 05/20/2019 1527  ? ALBUMIN 4.3 01/01/2022 0750  ? ALBUMIN 3.9 05/20/2019 1527  ?  AST 22 01/01/2022 0750  ? ALT 18 01/01/2022 0750  ? ALKPHOS 68 01/01/2022 0750  ? BILITOT 0.6 01/01/2022 0750  ? BILITOT <0.2 05/20/2019 1527  ? GFRNONAA >60 01/04/2022 0413  ? GFRAA 91 05/20/2019 1527  ? ?Lipase  ?   ?Component Value Date/Time  ? LIPASE 37 01/01/2022 0750  ? ? ? ? ? ?Studies/Results: ?DG Abd Portable 1V ? ?Result Date: 01/03/2022 ?CLINICAL DATA:  Small-bowel obstruction, parastomal hernia EXAM: PORTABLE ABDOMEN - 1 VIEW COMPARISON:  KUB 01/01/2022 FINDINGS: The enteric catheter tip is in stable position projecting over the expected location of the distal stomach/proximal duodenum. There is gaseous distention of small bowel in the left upper quadrant measuring up to 4.3 cm suggesting persistent obstruction. There is an overall paucity of bowel gas in the lower abdomen. There is no definite free intraperitoneal air, within the confines of supine technique. Gallstones are noted. The bones are stable. IMPRESSION: 1. Gaseous distention of bowel in the left upper quadrant measuring up to 4.3 cm suggesting persistent obstruction. 2. Enteric catheter in stable position terminating in the distal stomach/proximal duodenum. Electronically Signed   By: Valetta Mole M.D.   On: 01/03/2022 07:51   ? ?Anti-infectives: ?Anti-infectives (From admission, onward)  ? ? Start     Dose/Rate Route Frequency Ordered Stop  ? 01/04/22 0830  ceFAZolin (ANCEF) IVPB 2g/100 mL premix       ? 2 g ?200 mL/hr over 30 Minutes Intravenous  Once 01/04/22 0734 01/04/22 0940  ? ?  ? ? ? ?Assessment/Plan ? Parastomal hernia s/p laparoscopic repair 11/13/2021 by Dr. Johney Maine ?Recurrent parastomal hernia with SBO ?- CT w/ SBO secondary to parastomal hernia. Small amount of fluid within hernia sac without free air or pneumatosis ?- KUB this AM with dilated loops of small bowel still evident  ?- pt with no further output ?- OR today for ex-lap and reduction of parastomal hernia  ?  ?  ?FEN: NPO/NGT, IVF @ 151ml/hr ?ID: Ancef pre-op ?VTE: lovenox    ? LOS: 3 days  ? ? ? ?Norm Parcel, PA-C ?[facility] ?01/04/2022, 11:31 AM ?Please see Amion for pager number during day hours 7:00am-4:30pm ? ?

## 2022-01-05 ENCOUNTER — Encounter (HOSPITAL_COMMUNITY): Payer: Self-pay | Admitting: Surgery

## 2022-01-05 DIAGNOSIS — K56609 Unspecified intestinal obstruction, unspecified as to partial versus complete obstruction: Secondary | ICD-10-CM | POA: Diagnosis not present

## 2022-01-05 LAB — BASIC METABOLIC PANEL
Anion gap: 6 (ref 5–15)
BUN: 13 mg/dL (ref 6–20)
CO2: 21 mmol/L — ABNORMAL LOW (ref 22–32)
Calcium: 7.9 mg/dL — ABNORMAL LOW (ref 8.9–10.3)
Chloride: 112 mmol/L — ABNORMAL HIGH (ref 98–111)
Creatinine, Ser: 0.56 mg/dL (ref 0.44–1.00)
GFR, Estimated: >60 mL/min (ref >60)
Glucose, Bld: 114 mg/dL — ABNORMAL HIGH (ref 70–99)
Potassium: 3.8 mmol/L (ref 3.5–5.1)
Sodium: 139 mmol/L (ref 135–145)

## 2022-01-05 LAB — CBC
HCT: 34.2 % — ABNORMAL LOW (ref 36.0–46.0)
Hemoglobin: 10.7 g/dL — ABNORMAL LOW (ref 12.0–15.0)
MCH: 27.9 pg (ref 26.0–34.0)
MCHC: 31.3 g/dL (ref 30.0–36.0)
MCV: 89.1 fL (ref 80.0–100.0)
Platelets: 265 10*3/uL (ref 150–400)
RBC: 3.84 MIL/uL — ABNORMAL LOW (ref 3.87–5.11)
RDW: 13.6 % (ref 11.5–15.5)
WBC: 5.6 10*3/uL (ref 4.0–10.5)
nRBC: 0 % (ref 0.0–0.2)

## 2022-01-05 NOTE — Progress Notes (Signed)
1 Day Post-Op  ? ?Subjective/Chief Complaint: ?Feels ok ?Some pain around the incision  ? ? ?Objective: ?Vital signs in last 24 hours: ?Temp:  [97.6 ?F (36.4 ?C)-99 ?F (37.2 ?C)] 97.7 ?F (36.5 ?C) (04/29 0935) ?Pulse Rate:  [75-103] 83 (04/29 0935) ?Resp:  [12-18] 12 (04/29 0935) ?BP: (93-155)/(64-95) 155/95 (04/29 0935) ?SpO2:  [93 %-100 %] 100 % (04/29 0935) ?Weight:  [024 kg] 117 kg (04/28 1313) ?Last BM Date : 01/01/22 ? ?Intake/Output from previous day: ?04/28 0701 - 04/29 0700 ?In: 2260.3 [P.O.:210; I.V.:1420.3; IV Piggyback:630] ?Out: 1575 [Urine:1100; Emesis/NG output:450; Blood:25] ?Intake/Output this shift: ?Total I/O ?In: -  ?Out: 700 [Emesis/NG output:700] ? ?Dressing dry appropriately sore no peritonitis ostomy intact no output  ? ?Lab Results:  ?Recent Labs  ?  01/05/22 ?0348  ?WBC 5.6  ?HGB 10.7*  ?HCT 34.2*  ?PLT 265  ? ?BMET ?Recent Labs  ?  01/04/22 ?0413 01/05/22 ?0348  ?NA 139 139  ?K 3.1* 3.8  ?CL 112* 112*  ?CO2 20* 21*  ?GLUCOSE 118* 114*  ?BUN 14 13  ?CREATININE 0.67 0.56  ?CALCIUM 8.4* 7.9*  ? ?PT/INR ?No results for input(s): LABPROT, INR in the last 72 hours. ?ABG ?No results for input(s): PHART, HCO3 in the last 72 hours. ? ?Invalid input(s): PCO2, PO2 ? ?Studies/Results: ?No results found. ? ?Anti-infectives: ?Anti-infectives (From admission, onward)  ? ? Start     Dose/Rate Route Frequency Ordered Stop  ? 01/04/22 1224  ceFAZolin (ANCEF) 2-4 GM/100ML-% IVPB       ?Note to Pharmacy: Kandice Hams D: cabinet override  ?    01/04/22 1224 01/04/22 1313  ? 01/04/22 0830  ceFAZolin (ANCEF) IVPB 2g/100 mL premix       ? 2 g ?200 mL/hr over 30 Minutes Intravenous  Once 01/04/22 0734 01/04/22 1910  ? ?  ? ? ?Assessment/Plan: ?s/p Procedure(s): ?EXPLORATORY LAPAROTOMY, HERNIA REPAIR PARASTOMAL (N/A) ?Cont NGT  ?OOB  ?Await return of bowel function  ? LOS: 4 days  ? ? ?Dortha Schwalbe MD  ?01/05/2022 ? ?

## 2022-01-05 NOTE — Progress Notes (Signed)
OT Cancellation Note ? ?Patient Details ?Name: Lindsay Diaz ?MRN: [id number] ?DOB: [birth year] ? ? ?Cancelled Treatment:    Reason Eval/Treat Not Completed: Pain limiting ability to participate ?Patients pain level at this time is preventing patient from participation. OT to continue to follow and check back as schedule will allow.  ?Diavian Furgason OTR/L, MS ?Acute Rehabilitation Department ?Office# [phone] ?Pager# [phone] ? ?01/05/2022, 3:34 PM ?

## 2022-01-05 NOTE — Progress Notes (Signed)
? ?PROGRESS NOTE ? ?Lindsay SimmondsCheri Wenk [id number] DOB: 10-30-1970 DOA: 01/01/2022 ?PCP: [facility] ? ?HPI/Recap of past 24 hours: ?Patient is a 51 year old female with history of ulcerative colitis status post colostomy, recent SBO with parastomal hernia, bowel obstruction, hypertension, anxiety/depression who presented with recurrent abdomen, nausea, vomiting.  CT abdomen/pelvis on presentation showed SBO secondary to parastomal hernia.  Patient admitted for the management of SBO.  General surgery following.  She did not improve with conservative management.  Post exploratory laparotomy by general surgery on 01/04/22.  ? ?01/05/2022: Patient was seen and examined at her bedside.  Her pain is currently 5 out of 10.  NG tube in place to suction. ? ? ?Assessment/Plan: ?Principal Problem: ?  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 ?Active Problems: ?  HTN (hypertension) ?  Morbid obesity with body mass index (BMI) of 40.0 to 49.9 (HCC) ?  Hypokalemia ? ?Recurrent SBO status post exploratory laparotomy by general surgery on 01/04/2022:  ?Presented with abdomen pain, nausea, vomiting.  ?Imaging showed SBO on presentation.   ?NG tube placed, continue.   ?On  IV fluids ?Continue supportive care ?Seen by general surgery, appreciate assistance  ?Pain management ?Out of bed to chair, mobilize as tolerated. ?  ?Suspected aspiration pneumonia:  ?Respiratory status is stable.  Not hypoxic.   ?Continue to monitor off antibiotics. ?  ?Resolved hypokalemia:  ?Replete electrolytes as indicated. ?Goal potassium greater than 4.0 ?Goal magnesium greater than 2.0 ?  ?Resolved leukocytosis:  ?  ?Hypertension: Blood pressure stable.  Continue as needed medications for severe hypertension.   ?  ?Anxiety/depression: On bupropion, citalopram at home ?  ?Morbid obesity: BMI of 40.7 recommend weight loss outpatient with regular physical activity and healthy dieting. ?  ?  ?  ?  ?DVT  prophylaxis:enoxaparin (LOVENOX) injection 40 mg Start: 01/01/22 2200 ?  ?  ?  Code Status: Full Code ?  ?Family Communication: Family at bedside on 4/26 ?  ?Patient status:Inpatient ?  ?Patient is from :Home ?  ?Anticipated discharge WJ:XBJYto:Home ?  ?Estimated DC date: We will discharge once general surgery signs off and the patient is hemodynamically stable. ?  ?  ?Consultants: Surgery ?  ?Procedures:None ?  ?Antimicrobials:  ?Anti-infectives (From admission, onward)  ?  ?  ?  Start     Dose/Rate Route Frequency Ordered Stop  ?  01/04/22 0830   ceFAZolin (ANCEF) IVPB 2g/100 mL premix       ? 2 g ?200 mL/hr over 30 Minutes Intravenous  Once 01/04/22 0734 01/04/22 0940  ?  ?   ?  ?  ?  ? ? ? ?Status is: Inpatient ?Patient requires at least 2 midnights for further evaluation and treatment of present condition. ? ? ? ?Objective: ?Vitals:  ? 01/04/22 2016 01/05/22 0205 01/05/22 [id number] 01/05/22 0935  ?BP: 132/66 93/64 131/78 (!) 155/95  ?Pulse: 91 87 75 83  ?Resp: 16 18 16 12   ?Temp: 98.7 ?F (37.1 ?C) 97.6 ?F (36.4 ?C) 97.6 ?F (36.4 ?C) 97.7 ?F (36.5 ?C)  ?TempSrc: Oral Oral Oral Oral  ?SpO2: 96% 97% 100% 100%  ?Weight:      ?Height:      ? ? ?Intake/Output Summary (Last 24 hours) at 01/05/2022 1222 ?Last data filed at 01/05/2022 1213 ?Gross per 24 hour  ?Intake 2260.28 ml  ?Output 2425 ml  ?Net -164.72 ml  ? ?Filed Weights  ? 01/01/22 0739 01/04/22 1313  ?Weight: 117.9 kg 117 kg  ? ? ?  Exam: ? ?General: 51 y.o. year-old female well developed well nourished in no acute distress.  Alert and oriented x3.  NG tube in place. ?Cardiovascular: Regular rate and rhythm with no rubs or gallops.  No thyromegaly or JVD noted.   ?Respiratory: Clear to auscultation with no wheezes or rales. Good inspiratory effort. ?Abdomen: Incisions appear dry and clean.  Hypoactive bowel sounds. ?Musculoskeletal: No lower extremity edema. 2/4 pulses in all 4 extremities. ?Skin: No ulcerative lesions noted or rashes, ?Psychiatry: Mood is appropriate for  condition and setting ? ? ?Data Reviewed: ?CBC: ?Recent Labs  ?Lab 01/01/22 ?0750 01/02/22 ?4818 01/05/22 ?0348  ?WBC 12.9* 6.9 5.6  ?HGB 14.3 13.1 10.7*  ?HCT 43.7 40.9 34.2*  ?MCV 86.5 86.8 89.1  ?PLT 463* 440* 265  ? ?Basic Metabolic Panel: ?Recent Labs  ?Lab 01/01/22 ?0750 01/02/22 ?5631 01/03/22 ?0402 01/04/22 ?0413 01/05/22 ?4970  ?NA 137 142 140 139 139  ?K 4.1 3.8 3.5 3.1* 3.8  ?CL 106 112* 112* 112* 112*  ?CO2 19* 21* 20* 20* 21*  ?GLUCOSE 181* 147* 98 118* 114*  ?BUN 13 32* 24* 14 13  ?CREATININE 0.90 1.09* 0.81 0.67 0.56  ?CALCIUM 10.8* 9.2 8.8* 8.4* 7.9*  ? ?GFR: ?Estimated Creatinine Clearance: 108.8 mL/min (by C-G formula based on SCr of 0.56 mg/dL). ?Liver Function Tests: ?Recent Labs  ?Lab 01/01/22 ?0750  ?AST 22  ?ALT 18  ?ALKPHOS 68  ?BILITOT 0.6  ?PROT 8.1  ?ALBUMIN 4.3  ? ?Recent Labs  ?Lab 01/01/22 ?0750  ?LIPASE 37  ? ?No results for input(s): AMMONIA in the last 168 hours. ?Coagulation Profile: ?No results for input(s): INR, PROTIME in the last 168 hours. ?Cardiac Enzymes: ?No results for input(s): CKTOTAL, CKMB, CKMBINDEX, TROPONINI in the last 168 hours. ?BNP (last 3 results) ?No results for input(s): PROBNP in the last 8760 hours. ?HbA1C: ?No results for input(s): HGBA1C in the last 72 hours. ?CBG: ?No results for input(s): GLUCAP in the last 168 hours. ?Lipid Profile: ?No results for input(s): CHOL, HDL, LDLCALC, TRIG, CHOLHDL, LDLDIRECT in the last 72 hours. ?Thyroid Function Tests: ?No results for input(s): TSH, T4TOTAL, FREET4, T3FREE, THYROIDAB in the last 72 hours. ?Anemia Panel: ?No results for input(s): VITAMINB12, FOLATE, FERRITIN, TIBC, IRON, RETICCTPCT in the last 72 hours. ?Urine analysis: ?   ?Component Value Date/Time  ? COLORURINE YELLOW 01/01/2022 1430  ? APPEARANCEUR HAZY (A) 01/01/2022 1430  ? LABSPEC <1.005 (L) 01/01/2022 1430  ? PHURINE 5.0 01/01/2022 1430  ? GLUCOSEU NEGATIVE 01/01/2022 1430  ? HGBUR SMALL (A) 01/01/2022 1430  ? BILIRUBINUR NEGATIVE 01/01/2022 1430  ?  KETONESUR NEGATIVE 01/01/2022 1430  ? PROTEINUR 30 (A) 01/01/2022 1430  ? NITRITE NEGATIVE 01/01/2022 1430  ? LEUKOCYTESUR NEGATIVE 01/01/2022 1430  ? ?Sepsis Labs: ?@LABRCNTIP (procalcitonin:4,lacticidven:4) ? ?) ?Recent Results (from the past 240 hour(s))  ?Surgical PCR screen     Status: None  ? Collection Time: 01/04/22  3:49 AM  ? Specimen: Nasal Mucosa; Nasal Swab  ?Result Value Ref Range Status  ? MRSA, PCR NEGATIVE NEGATIVE Final  ? Staphylococcus aureus NEGATIVE NEGATIVE Final  ?  Comment: (NOTE) ?The Xpert SA Assay (FDA approved for NASAL specimens in patients 64 ?years of age and older), is one component of a comprehensive ?surveillance program. It is not intended to diagnose infection nor to ?guide or monitor treatment. ?Performed at [facility], [address] ?[place], [place] ?  ?  ? ? ?Studies: ?No results found. ? ?Scheduled Meds: ? Chlorhexidine Gluconate Cloth  6 each Topical Daily  ? enoxaparin (LOVENOX) injection  40 mg Subcutaneous Q24H  ? pantoprazole (PROTONIX) IV  40 mg Intravenous Q24H  ? ? ?Continuous Infusions: ? sodium chloride 100 mL/hr at 01/04/22 0143  ? methocarbamol (ROBAXIN) IV    ? promethazine (PHENERGAN) injection (IM or IVPB) 12.5 mg (01/03/22 0452)  ? ? ? LOS: 4 days  ? ? ? ?Darlin Drop, MD ?[facility] ?Pager [phone] ? ?If 7PM-7AM, please contact night-coverage ?[email or web address] ?Password [email or web address] ?01/05/2022, 12:22 PM  ?  ?

## 2022-01-05 NOTE — Evaluation (Signed)
Physical Therapy Evaluation ?Patient Details ?Name: Lindsay Diaz ?MRN: [id number] ?DOB: 25-May-1971 ?Today's Date: 01/05/2022 ? ?History of Present Illness ? Pt is a 51 y.o. female who presents with recurrent abdominal pain nauseous vomiting, constipation s/p exploratory laparotomy, primary repair parastomal hernia. Past medical history significant of ulcerative colitis status post colostomy, recent SBO with parastomal hernia and bowel obstruction, HTN, and anxiety/depression. ?  ?Clinical Impression ? Pt is a 51 y.o. female with above listed HPI resulting in the deficits listed below (see PT Problem List). Pt lives with 2 sons and is typically independent at baseline. Pt educated on use of log roll technique for bed mobility for protection of abdominal incision and able to perform with use of bed rails/features. Pt performed sit to stand transfers with MIN guard for safety, ambulated 34ft around room without assistive device and supervision for safety- in chair at end of session. PT educated pt on circulation interventions to be performed throughout day outside of therapy sessions, pt verbalized understanding. Pt will have assist from her sons upon d/c. Pt will benefit from skilled PT to maximize functional mobility and increase independence.  ?   ?   ? ?Recommendations for follow up therapy are one component of a multi-disciplinary discharge planning process, led by the attending physician.  Recommendations may be updated based on patient status, additional functional criteria and insurance authorization. ? ?Follow Up Recommendations No PT follow up ? ?  ?Assistance Recommended at Discharge Intermittent Supervision/Assistance  ?Patient can return home with the following ? A little help with walking and/or transfers;A little help with bathing/dressing/bathroom;Assistance with cooking/housework;Assist for transportation;Help with stairs or ramp for entrance ? ?  ?Equipment Recommendations None recommended by PT   ?Recommendations for Other Services ?    ?  ?Functional Status Assessment Patient has had a recent decline in their functional status and demonstrates the ability to make significant improvements in function in a reasonable and predictable amount of time.  ? ?  ?Precautions / Restrictions Precautions ?Precautions: Other (comment) ?Precaution Comments: abdominal incision ?Restrictions ?Weight Bearing Restrictions: No  ? ?  ? ?Mobility ? Bed Mobility ?Overal bed mobility: Needs Assistance ?Bed Mobility: Supine to Sit ?  ?  ?Supine to sit: Supervision, HOB elevated ?  ?  ?General bed mobility comments: use of bed rail ?  ? ?Transfers ?Overall transfer level: Needs assistance ?Equipment used: None ?Transfers: Sit to/from Stand ?Sit to Stand: Min guard ?  ?  ?  ?  ?  ?General transfer comment: MIN guard for safety, increased time to achieve full upright posture due to pain/guarding of abdomen ?  ? ?Ambulation/Gait ?Ambulation/Gait assistance: Supervision ?Gait Distance (Feet): 16 Feet ?Assistive device: None ?Gait Pattern/deviations: Step-through pattern, Decreased stride length ?Gait velocity: decr ?  ?  ?General Gait Details: no overt LOB observed, slow speed due to pain. Able to ambulate forward/backward within room. limited to within room - pt on NG tube to wall suction at time of eval. ? ?Stairs ?  ?  ?  ?  ?  ? ?Wheelchair Mobility ?  ? ?Modified Rankin (Stroke Patients Only) ?  ? ?  ? ?Balance Overall balance assessment: No apparent balance deficits (not formally assessed) ?  ?  ?  ?  ?  ?  ?  ?  ?  ?  ?  ?  ?  ?  ?  ?  ?  ?  ?   ? ? ? ?Pertinent Vitals/Pain Pain Assessment ?Pain Assessment: 0-10 ?Pain Score: 8  ?  Pain Location: abdomen- worst with bed mobility, transfer ?Pain Descriptors / Indicators: Discomfort, Sore ?Pain Intervention(s): Monitored during session, Repositioned, Ice applied  ? ? ?Home Living Family/patient expects to be discharged to:: Private residence ?Living Arrangements: Children ?Available  Help at Discharge: Family ?Type of Home: Apartment (2nd level) ?Home Access: Stairs to enter ?Entrance Stairs-Rails: Can reach both ?Entrance Stairs-Number of Steps: 20 ?  ?Home Layout: One level ?Home Equipment: Hand held shower head ?Additional Comments: Pt reports she had recent laproscopic procedure in march and was on certiain lifting restrictions so sons have been helping with home management, cooking, and cleaning, independent prior to.  2 sons are on different work shifts so can help at home when needed.  ?  ?Prior Function Prior Level of Function : Independent/Modified Independent;Driving;Working/employed ?  ?  ?  ?  ?  ?  ?  ?  ?  ? ? ?Hand Dominance  ?   ? ?  ?Extremity/Trunk Assessment  ? Upper Extremity Assessment ?Upper Extremity Assessment: Overall WFL for tasks assessed ?  ? ?Lower Extremity Assessment ?Lower Extremity Assessment: Overall WFL for tasks assessed ?  ? ?Cervical / Trunk Assessment ?Cervical / Trunk Assessment: Normal  ?Communication  ? Communication: No difficulties  ?Cognition Arousal/Alertness: Awake/alert ?Behavior During Therapy: [facility] for tasks assessed/performed ?Overall Cognitive Status: Within Functional Limits for tasks assessed ?  ?  ?  ?  ?  ?  ?  ?  ?  ?  ?  ?  ?  ?  ?  ?  ?  ?  ?  ? ?  ?General Comments   ? ?  ?Exercises    ? ?Assessment/Plan  ?  ?PT Assessment Patient needs continued PT services  ?PT Problem List Decreased strength;Decreased activity tolerance;Decreased mobility;Pain ? ?   ?  ?PT Treatment Interventions Gait training;Stair training;Functional mobility training;Therapeutic activities;Therapeutic exercise;Balance training;Patient/family education   ? ?PT Goals (Current goals can be found in the Care Plan section)  ?Acute Rehab PT Goals ?Patient Stated Goal: Get back home and have good/quick recovery ?PT Goal Formulation: With patient ?Time For Goal Achievement: 01/19/22 ?Potential to Achieve Goals: Good ? ?  ?Frequency Min 3X/week ?  ? ? ?Co-evaluation   ?  ?   ?  ?  ? ? ?  ?AM-PAC PT "6 Clicks" Mobility  ?Outcome Measure Help needed turning from your back to your side while in a flat bed without using bedrails?: A Little ?Help needed moving from lying on your back to sitting on the side of a flat bed without using bedrails?: A Lot ?Help needed moving to and from a bed to a chair (including a wheelchair)?: A Little ?Help needed standing up from a chair using your arms (e.g., wheelchair or bedside chair)?: A Little ?Help needed to walk in hospital room?: A Little ?Help needed climbing 3-5 steps with a railing? : A Lot ?6 Click Score: 16 ? ?  ?End of Session   ?Activity Tolerance: Patient tolerated treatment well ?Patient left: in chair;with call bell/phone within reach ?Nurse Communication: Mobility status ?PT Visit Diagnosis: Other abnormalities of gait and mobility (R26.89);Pain ?Pain - Right/Left:  (mid) ?Pain - part of body:  (abdomen) ?  ? ?Time: 1000-1020 ?PT Time Calculation (min) (ACUTE ONLY): 20 min ? ? ?Charges:   PT Evaluation ?$PT Eval Low Complexity: 1 Low ?  ?  ?   ? ? ?Lyman Speller., PT, DPT  ?Acute Rehabilitation Services  ?Office [phone] ? ?01/05/2022, 10:36 AM ? ?

## 2022-01-06 ENCOUNTER — Inpatient Hospital Stay (HOSPITAL_COMMUNITY): Payer: BC Managed Care – PPO

## 2022-01-06 DIAGNOSIS — K56609 Unspecified intestinal obstruction, unspecified as to partial versus complete obstruction: Secondary | ICD-10-CM | POA: Diagnosis not present

## 2022-01-06 LAB — RENAL FUNCTION PANEL
Albumin: 2.5 g/dL — ABNORMAL LOW (ref 3.5–5.0)
Anion gap: 8 (ref 5–15)
BUN: 13 mg/dL (ref 6–20)
CO2: 20 mmol/L — ABNORMAL LOW (ref 22–32)
Calcium: 8.3 mg/dL — ABNORMAL LOW (ref 8.9–10.3)
Chloride: 112 mmol/L — ABNORMAL HIGH (ref 98–111)
Creatinine, Ser: 0.59 mg/dL (ref 0.44–1.00)
GFR, Estimated: >60 mL/min (ref >60)
Glucose, Bld: 82 mg/dL (ref 70–99)
Phosphorus: 1.4 mg/dL — ABNORMAL LOW (ref 2.5–4.6)
Potassium: 3.2 mmol/L — ABNORMAL LOW (ref 3.5–5.1)
Sodium: 140 mmol/L (ref 135–145)

## 2022-01-06 LAB — MAGNESIUM: Magnesium: 2.3 mg/dL (ref 1.7–2.4)

## 2022-01-06 MED ORDER — POTASSIUM PHOSPHATES 15 MMOLE/5ML IV SOLN
30.0000 mmol | Freq: Once | INTRAVENOUS | Status: AC
  Administered 2022-01-06: 30 mmol via INTRAVENOUS
  Filled 2022-01-06: qty 10

## 2022-01-06 MED ORDER — TRAZODONE HCL 50 MG PO TABS
25.0000 mg | ORAL_TABLET | Freq: Every evening | ORAL | Status: DC | PRN
Start: 2022-01-06 — End: 2022-01-08
  Administered 2022-01-06 – 2022-01-07 (×2): 25 mg via ORAL
  Filled 2022-01-06 (×2): qty 1

## 2022-01-06 MED ORDER — DEXTROSE IN LACTATED RINGERS 5 % IV SOLN
INTRAVENOUS | Status: AC
  Filled 2022-01-06 (×4): qty 1000

## 2022-01-06 NOTE — Progress Notes (Signed)
Physical Therapy Treatment ?Patient Details ?Name: Lindsay Diaz ?MRN: [id number] ?DOB: [birth year] ?Today's Date: 01/06/2022 ? ? ?History of Present Illness Pt is a 51 y.o. female who presents with recurrent abdominal pain nauseous vomiting, constipation s/p exploratory laparotomy, primary repair parastomal hernia. Past medical history significant of ulcerative colitis status post colostomy, recent SBO with parastomal hernia and bowel obstruction, HTN, and anxiety/depression. ? ?  ?PT Comments  ? ? Pt feeling "much better" and is Indep amb in hallway holding to IV pole, reported RN "earlier". ?Assisted pt OOB to amb without holding to IV pole.  Pt tolerated a great distance with LOB and WNL gait speed.  Pt has exceeded her Acute PT goals and she agrees, she no longer needs Acute PT.  Will update LPT to D/C from caseload. ?  ?Recommendations for follow up therapy are one component of a multi-disciplinary discharge planning process, led by the attending physician.  Recommendations may be updated based on patient status, additional functional criteria and insurance authorization. ? ?Follow Up Recommendations ? No PT follow up ?  ?  ?Assistance Recommended at Discharge Intermittent Supervision/Assistance  ?Patient can return home with the following Assist for transportation ?  ?Equipment Recommendations ? None recommended by PT  ?  ?Recommendations for Other Services   ? ? ?  ?Precautions / Restrictions Precautions ?Precautions: Other (comment) ?Precaution Comments: abdominal incision ?Restrictions ?Weight Bearing Restrictions: No  ?  ? ?Mobility ? Bed Mobility ?Overal bed mobility: Modified Independent ?  ?  ?  ?  ?  ?  ?General bed mobility comments: self able in/OOB ?  ? ?Transfers ?Overall transfer level: Modified independent ?Equipment used: None ?Transfers: Sit to/from Stand ?Sit to Stand: Modified independent (Device/Increase time) ?  ?  ?  ?  ?  ?General transfer comment: self able with good use hands to steady  self ?  ? ?Ambulation/Gait ?Ambulation/Gait assistance: Modified independent (Device/Increase time) ?Gait Distance (Feet): 350 Feet ?Assistive device: None ?Gait Pattern/deviations: Step-through pattern, Decreased stride length ?Gait velocity: decr ?  ?  ?General Gait Details: pt self able to amb a great distance in hallway without need for any AD.  Feeling "much better". ? ? ?Stairs ?  ?  ?  ?  ?  ? ? ?Wheelchair Mobility ?  ? ?Modified Rankin (Stroke Patients Only) ?  ? ? ?  ?Balance   ?  ?  ?  ?  ?  ?  ?  ?  ?  ?  ?  ?  ?  ?  ?  ?  ?  ?  ?  ? ?  ?Cognition Arousal/Alertness: Awake/alert ?Behavior During Therapy: [facility] for tasks assessed/performed ?Overall Cognitive Status: Within Functional Limits for tasks assessed ?  ?  ?  ?  ?  ?  ?  ?  ?  ?  ?  ?  ?  ?  ?  ?  ?General Comments: AxO x 3 very pleasant and self motivated ?  ?  ? ?  ?Exercises   ? ?  ?General Comments   ?  ?  ? ?Pertinent Vitals/Pain Pain Assessment ?Pain Assessment: No/denies pain  ? ? ?Home Living Family/patient expects to be discharged to:: Private residence ?Living Arrangements: Children ?Available Help at Discharge: Family ?Type of Home: Apartment ?Home Access: Stairs to enter ?Entrance Stairs-Rails: Can reach both ?Entrance Stairs-Number of Steps: 20 ?  ?Home Layout: One level ?Home Equipment: Hand held shower head ?Additional Comments: son helps with IADLs.  ?  ?Prior  Function    ?  ?  ?   ? ?PT Goals (current goals can now be found in the care plan section) Progress towards PT goals: Progressing toward goals ? ?  ?Frequency ? ? ?   ? ? ? ?  ?PT Plan Current plan remains appropriate  ? ? ?Co-evaluation   ?  ?  ?  ?  ? ?  ?AM-PAC PT "6 Clicks" Mobility   ?Outcome Measure ? Help needed turning from your back to your side while in a flat bed without using bedrails?: None ?Help needed moving from lying on your back to sitting on the side of a flat bed without using bedrails?: None ?Help needed moving to and from a bed to a chair (including a  wheelchair)?: None ?Help needed standing up from a chair using your arms (e.g., wheelchair or bedside chair)?: None ?Help needed to walk in hospital room?: None ?Help needed climbing 3-5 steps with a railing? : None ?6 Click Score: 24 ? ?  ?End of Session Equipment Utilized During Treatment: Gait belt ?Activity Tolerance: Patient tolerated treatment well ?Patient left: in bed;with call bell/phone within reach ?Nurse Communication: Mobility status ?PT Visit Diagnosis: Other abnormalities of gait and mobility (R26.89);Pain ?  ? ? ?Time: 1348-1400 ?PT Time Calculation (min) (ACUTE ONLY): 12 min ? ?Charges:  $Gait Training: 8-22 mins          ?          ? ?Rica Koyanagi  PTA ?Acute  Rehabilitation Services ?Pager      [phone] ?Office      [phone] ? ?

## 2022-01-06 NOTE — Progress Notes (Signed)
2 Days Post-Op  ? ?Subjective/Chief Complaint: ?Patient walking in halls.  She pulled her NG tube out.  She is having ostomy output.  Her pain is well controlled. ? ? ?Objective: ?Vital signs in last 24 hours: ?Temp:  [97.7 ?F (36.5 ?C)-98.2 ?F (36.8 ?C)] 98.2 ?F (36.8 ?C) (04/30 [id number]) ?Pulse Rate:  [84-100] 100 (04/30 [id number]) ?Resp:  [16-20] 18 (04/30 [id number]) ?BP: (138-157)/(77-83) 138/80 (04/30 [id number]) ?SpO2:  [99 %-100 %] 100 % (04/30 [id number]) ?Last BM Date : 01/01/22 ? ?Intake/Output from previous day: ?04/29 0701 - 04/30 0700 ?In: 2181 [P.O.:210; I.V.:1948.3; IV Piggyback:22.7] ?Out: [id number] [Urine:2025; Emesis/NG output:2800] ?Intake/Output this shift: ?Total I/O ?In: -  ?Out: 200 [Urine:200] ? ?General appearance: alert and cooperative ?Incision/Wound: Ostomy has gas and is functioning.  Is viable.  Wound is clean and dressed. ? ?Lab Results:  ?Recent Labs  ?  01/05/22 ?0348  ?WBC 5.6  ?HGB 10.7*  ?HCT 34.2*  ?PLT 265  ? ?BMET ?Recent Labs  ?  01/05/22 ?0348 01/06/22 ?0358  ?NA 139 140  ?K 3.8 3.2*  ?CL 112* 112*  ?CO2 21* 20*  ?GLUCOSE 114* 82  ?BUN 13 13  ?CREATININE 0.56 0.59  ?CALCIUM 7.9* 8.3*  ? ?PT/INR ?No results for input(s): LABPROT, INR in the last 72 hours. ?ABG ?No results for input(s): PHART, HCO3 in the last 72 hours. ? ?Invalid input(s): PCO2, PO2 ? ?Studies/Results: ?No results found. ? ?Anti-infectives: ?Anti-infectives (From admission, onward)  ? ? Start     Dose/Rate Route Frequency Ordered Stop  ? 01/04/22 1224  ceFAZolin (ANCEF) 2-4 GM/100ML-% IVPB       ?Note to Pharmacy: Rocky Morel D: cabinet override  ?    01/04/22 1224 01/04/22 1313  ? 01/04/22 0830  ceFAZolin (ANCEF) IVPB 2g/100 mL premix       ? 2 g ?200 mL/hr over 30 Minutes Intravenous  Once 01/04/22 0734 01/04/22 1910  ? ?  ? ? ?Assessment/Plan: ?s/p Procedure(s): ?EXPLORATORY LAPAROTOMY, HERNIA REPAIR PARASTOMAL (N/A) ? ? ? ? ?Leave NG tube out for now.  If she develops vomiting, it will be replaced ? ?Keep on ice chips ? ?Continue to  ambulate ? LOS: 5 days  ? ? ?Turner Daniels MD  ?01/06/2022 ? ?

## 2022-01-06 NOTE — Progress Notes (Addendum)
? ?PROGRESS NOTE ? ?Lindsay Diaz VXB:[id number] DOB: Feb 04, 1971 DOA: 01/01/2022 ?PCP: [facility] ? ?HPI/Recap of past 24 hours: ?Patient is a 51 year old female with history of ulcerative colitis status post colostomy, recent SBO with parastomal hernia, bowel obstruction, hypertension, anxiety/depression who presented with recurrent abdomen, nausea, vomiting.  CT abdomen/pelvis on presentation showed SBO secondary to parastomal hernia.  Patient admitted for the management of SBO.  General surgery following.  She did not improve with conservative management.  Post exploratory laparotomy by general surgery on 01/04/22.  She pulled out her NG tube on 01/06/2022.  Per general surgery, it will be replaced if she develops vomiting. ? ?01/06/2022: She has some mild abdominal pain this morning. ? ? ?Assessment/Plan: ?Principal Problem: ?  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 ?Active Problems: ?  HTN (hypertension) ?  Morbid obesity with body mass index (BMI) of 40.0 to 49.9 (HCC) ?  Hypokalemia ? ?Recurrent SBO status post exploratory laparotomy by general surgery on 01/04/2022:  ?Presented with abdomen pain, nausea, vomiting.  ?Imaging showed SBO on presentation.   ?NG tube placed, and pulled out on 01/06/2022.   ?Continue IV fluid hydration ?Optimize magnesium and potassium levels ?Goal magnesium greater than 2.0 ?Goal potassium greater than 4.0. ?Continue pain control. ? ?Hypokalemia ?Serum potassium 3.2 ?Repleted intravenously ?Continue D5 LR KCl 40 mEq at 75 cc/h. ?We will repeat chemistry panel in the morning. ? ?Hypophosphatemia ?Phosphorus 1.4 ?Repleted intravenously. ?  ?Suspected aspiration pneumonia:  ?Respiratory status is stable.  Not hypoxic.   ?Continue to monitor off antibiotics. ?  ?Resolved leukocytosis:  ?  ?Hypertension:  ?Blood pressure stable. ?Continue to monitor vital signs. ? ?Anxiety/depression: ?Stable ?Continue home bupropion, citalopram ?   ?Morbid obesity:  ?BMI of 40.7 recommend weight loss outpatient with regular physical activity and healthy dieting. ?  ?  ?  ?  ?DVT prophylaxis:enoxaparin (LOVENOX) injection 40 mg Start: 01/01/22 2200 ?  ?  ?  Code Status: Full Code ?  ?Family Communication: None at bedside. ?  ?Patient status:Inpatient ?  ?Patient is from :Home ?  ?Anticipated discharge ZR:AQTM when general surgery ?2. ?  ?Estimated DC date: We will discharge once general surgery signs off and the patient is hemodynamically stable. ?  ?  ?Consultants: Surgery ?  ?Procedures:None ?  ?Antimicrobials:  ?Anti-infectives (From admission, onward)  ?  ?  ?  Start     Dose/Rate Route Frequency Ordered Stop  ?  01/04/22 0830   ceFAZolin (ANCEF) IVPB 2g/100 mL premix       ? 2 g ?200 mL/hr over 30 Minutes Intravenous  Once 01/04/22 0734 01/04/22 0940  ?  ?   ?  ?  ?  ? ? ? ?Status is: Inpatient ?Patient requires at least 2 midnights for further evaluation and treatment of present condition. ? ? ? ?Objective: ?Vitals:  ? 01/05/22 1329 01/05/22 2140 01/06/22 2263 01/06/22 1239  ?BP: (!) 149/77 (!) 157/83 138/80 (!) 148/78  ?Pulse: 84 86 100 98  ?Resp: 16 20 18 16   ?Temp: 97.7 ?F (36.5 ?C) 97.7 ?F (36.5 ?C) 98.2 ?F (36.8 ?C) 98.7 ?F (37.1 ?C)  ?TempSrc: Oral Oral  Oral  ?SpO2: 100% 99% 100% 99%  ?Weight:      ?Height:      ? ? ?Intake/Output Summary (Last 24 hours) at 01/06/2022 1324 ?Last data filed at 01/06/2022 1300 ?Gross per 24 hour  ?Intake 2151.01 ml  ?Output 4025 ml  ?Net -1873.99 ml  ? ?  Filed Weights  ? 01/01/22 0739 01/04/22 1313  ?Weight: 117.9 kg 117 kg  ? ? ?Exam: ? ?General: 51 y.o. year-old female well-developed well-nourished in no acute distress.  She is alert and oriented x3.  At the time of the visit she had an NG tube in place.   ?Cardiovascular: Regular rate and rhythm no rubs gallops. ?Respiratory: Clear to auscultation no wheezes or rales.   ?Abdomen: Incisions appear clean and intact.  Hypoactive bowel sounds. ?Musculoskeletal: No lower  extremity edema bilaterally.   ?Skin: No ulcerative lesions noted. ?Psychiatry: Mood is appropriate for condition. ? ? ?Data Reviewed: ?CBC: ?Recent Labs  ?Lab 01/01/22 ?0750 01/02/22 ?8242 01/05/22 ?0348  ?WBC 12.9* 6.9 5.6  ?HGB 14.3 13.1 10.7*  ?HCT 43.7 40.9 34.2*  ?MCV 86.5 86.8 89.1  ?PLT 463* 440* 265  ? ?Basic Metabolic Panel: ?Recent Labs  ?Lab 01/02/22 ?3536 01/03/22 ?1443 01/04/22 ?0413 01/05/22 ?1540 01/06/22 ?0867  ?NA 142 140 139 139 140  ?K 3.8 3.5 3.1* 3.8 3.2*  ?CL 112* 112* 112* 112* 112*  ?CO2 21* 20* 20* 21* 20*  ?GLUCOSE 147* 98 118* 114* 82  ?BUN 32* 24* 14 13 13   ?CREATININE 1.09* 0.81 0.67 0.56 0.59  ?CALCIUM 9.2 8.8* 8.4* 7.9* 8.3*  ?MG  --   --   --   --  2.3  ?PHOS  --   --   --   --  1.4*  ? ?GFR: ?Estimated Creatinine Clearance: 108.8 mL/min (by C-G formula based on SCr of 0.59 mg/dL). ?Liver Function Tests: ?Recent Labs  ?Lab 01/01/22 ?0750 01/06/22 ?0358  ?AST 22  --   ?ALT 18  --   ?ALKPHOS 68  --   ?BILITOT 0.6  --   ?PROT 8.1  --   ?ALBUMIN 4.3 2.5*  ? ?Recent Labs  ?Lab 01/01/22 ?0750  ?LIPASE 37  ? ?No results for input(s): AMMONIA in the last 168 hours. ?Coagulation Profile: ?No results for input(s): INR, PROTIME in the last 168 hours. ?Cardiac Enzymes: ?No results for input(s): CKTOTAL, CKMB, CKMBINDEX, TROPONINI in the last 168 hours. ?BNP (last 3 results) ?No results for input(s): PROBNP in the last 8760 hours. ?HbA1C: ?No results for input(s): HGBA1C in the last 72 hours. ?CBG: ?No results for input(s): GLUCAP in the last 168 hours. ?Lipid Profile: ?No results for input(s): CHOL, HDL, LDLCALC, TRIG, CHOLHDL, LDLDIRECT in the last 72 hours. ?Thyroid Function Tests: ?No results for input(s): TSH, T4TOTAL, FREET4, T3FREE, THYROIDAB in the last 72 hours. ?Anemia Panel: ?No results for input(s): VITAMINB12, FOLATE, FERRITIN, TIBC, IRON, RETICCTPCT in the last 72 hours. ?Urine analysis: ?   ?Component Value Date/Time  ? COLORURINE YELLOW 01/01/2022 1430  ? APPEARANCEUR HAZY (A)  01/01/2022 1430  ? LABSPEC <1.005 (L) 01/01/2022 1430  ? PHURINE 5.0 01/01/2022 1430  ? GLUCOSEU NEGATIVE 01/01/2022 1430  ? HGBUR SMALL (A) 01/01/2022 1430  ? BILIRUBINUR NEGATIVE 01/01/2022 1430  ? KETONESUR NEGATIVE 01/01/2022 1430  ? PROTEINUR 30 (A) 01/01/2022 1430  ? NITRITE NEGATIVE 01/01/2022 1430  ? LEUKOCYTESUR NEGATIVE 01/01/2022 1430  ? ?Sepsis Labs: ?@[email or web address] (procalcitonin:4,lacticidven:4) ? ?) ?Recent Results (from the past 240 hour(s))  ?Surgical PCR screen     Status: None  ? Collection Time: 01/04/22  3:49 AM  ? Specimen: Nasal Mucosa; Nasal Swab  ?Result Value Ref Range Status  ? MRSA, PCR NEGATIVE NEGATIVE Final  ? Staphylococcus aureus NEGATIVE NEGATIVE Final  ?  Comment: (NOTE) ?The Xpert SA Assay (FDA approved for NASAL specimens in patients  22 ?years of age and older), is one component of a comprehensive ?surveillance prog29ram. It is not intended to diagnose infection nor to ?guide or monitor treatment. ?Performed at [facility], [address] ?[place], [place] [id number] ?  ?  ? ? ?Studies: ?DG Abd Portable 1V ? ?Result Date: 01/06/2022 ?CLINICAL DATA:  Small bowel obstruction. Patient reports NG tube came out this morning. EXAM: PORTABLE ABDOMEN - 1 VIEW COMPARISON:  Radiograph 01/03/2022 FINDINGS: The enteric tube has been removed. Midline skin staples consistent with interval surgery. There is gaseous distention of bowel in the upper abdomen, likely combination of large and small bowel. Small bowel distention up to 4.4 cm. Multiple gallstones. IMPRESSION: 1. Gaseous distention of bowel in the upper abdomen likely representing both small and large bowel, postoperative ileus versus obstruction. 2. Multiple gallstones. Electronically Signed   By: Narda RutherfordMelanie  Sanford M.D.   On: 01/06/2022 12:27   ? ?Scheduled Meds: ? Chlorhexidine Gluconate Cloth  6 each Topical Daily  ? enoxaparin (LOVENOX) injection  40 mg Subcutaneous Q24H  ? pantoprazole (PROTONIX) IV  40 mg Intravenous  Q24H  ? ? ?Continuous Infusions: ? dextrose 5 % lactated ringers with kcl 75 mL/hr at 01/06/22 0604  ? methocarbamol (ROBAXIN) IV    ? promethazine (PHENERGAN) injection (IM or IVPB) 12.5 mg (01/03/22 0452)

## 2022-01-06 NOTE — Evaluation (Signed)
Occupational Therapy Evaluation ?Patient Details ?Name: Lindsay Diaz ?MRN: [id number] ?DOB: 1971/05/20 ?Today's Date: 01/06/2022 ? ? ?History of Present Illness Pt is a 51 y.o. female who presents with recurrent abdominal pain nauseous vomiting, constipation s/p exploratory laparotomy, primary repair parastomal hernia. Past medical history significant of ulcerative colitis status post colostomy, recent SBO with parastomal hernia and bowel obstruction, HTN, and anxiety/depression.  ? ?Clinical Impression ?  ?Patient evaluated by Occupational Therapy with no further acute OT needs identified. All education has been completed and the patient has no further questions. Patient is MI for ADL tasks at this time. Patient endorses not needing therapy.  See below for any follow-up Occupational Therapy or equipment needs. OT is signing off. Thank you for this referral.   ?   ? ?Recommendations for follow up therapy are one component of a multi-disciplinary discharge planning process, led by the attending physician.  Recommendations may be updated based on patient status, additional functional criteria and insurance authorization.  ? ?Follow Up Recommendations ? No OT follow up  ?  ?Assistance Recommended at Discharge PRN  ?Patient can return home with the following Assistance with cooking/housework;Assist for transportation ? ?  ?Functional Status Assessment ? Patient has not had a recent decline in their functional status  ?Equipment Recommendations ? Other (comment) (reacher)  ?  ?Recommendations for Other Services   ? ? ?  ?Precautions / Restrictions Precautions ?Precautions: Other (comment) ?Precaution Comments: abdominal incision ?Restrictions ?Weight Bearing Restrictions: No  ? ?  ? ?Mobility Bed Mobility ?Overal bed mobility: Modified Independent ?  ?  ?  ?  ?  ?  ?  ?  ? ?Transfers ?  ?  ?  ?  ?  ?  ?  ?  ?  ?  ?  ? ?  ?Balance Overall balance assessment: No apparent balance deficits (not formally assessed) ?  ?  ?  ?   ?  ?  ?  ?  ?  ?  ?  ?  ?  ?  ?  ?  ?  ?  ?   ? ?ADL either performed or assessed with clinical judgement  ? ?ADL Overall ADL's : Modified independent ?  ?  ?  ?  ?  ?  ?  ?  ?  ?  ?  ?  ?  ?  ?  ?  ?  ?  ?  ?General ADL Comments: with education on ECT and being mindful of incisions. patient was able to complet toileting tasks, bed mobility,LB dressing and functional moblity with MI on this date.  ? ? ? ?Vision Patient Visual Report: No change from baseline ?   ?   ?Perception   ?  ?Praxis   ?  ? ?Pertinent Vitals/Pain Pain Assessment ?Pain Assessment: 0-10 ?Pain Score: 5  ?Pain Location: abdomen ?Pain Descriptors / Indicators: Discomfort, Sore ?Pain Intervention(s): Monitored during session, Premedicated before session, Limited activity within patient's tolerance  ? ? ? ?Hand Dominance   ?  ?Extremity/Trunk Assessment Upper Extremity Assessment ?Upper Extremity Assessment: Overall WFL for tasks assessed ?  ?Lower Extremity Assessment ?Lower Extremity Assessment: Defer to PT evaluation ?  ?Cervical / Trunk Assessment ?Cervical / Trunk Assessment: Normal ?  ?Communication Communication ?Communication: No difficulties ?  ?Cognition Arousal/Alertness: Awake/alert ?Behavior During Therapy: [facility] for tasks assessed/performed ?Overall Cognitive Status: Within Functional Limits for tasks assessed ?  ?  ?  ?  ?  ?  ?  ?  ?  ?  ?  ?  ?  ?  ?  ?  ?  ?  ?  ?  General Comments    ? ?  ?Exercises   ?  ?Shoulder Instructions    ? ? ?Home Living Family/patient expects to be discharged to:: Private residence ?Living Arrangements: Children ?Available Help at Discharge: Family ?Type of Home: Apartment ?Home Access: Stairs to enter ?Entrance Stairs-Number of Steps: 20 ?Entrance Stairs-Rails: Can reach both ?Home Layout: One level ?  ?  ?Bathroom Shower/Tub: Tub/shower unit ?  ?Bathroom Toilet: Standard ?Bathroom Accessibility: Yes ?  ?Home Equipment: Hand held shower head ?  ?Additional Comments: son helps with IADLs. ?  ? ?  ?Prior  Functioning/Environment Prior Level of Function : Independent/Modified Independent;Driving;Working/employed ?  ?  ?  ?  ?  ?  ?  ?  ?  ? ?  ?  ?OT Problem List:   ?  ?   ?OT Treatment/Interventions:    ?  ?OT Goals(Current goals can be found in the care plan section) Acute Rehab OT Goals ?OT Goal Formulation: All assessment and education complete, DC therapy  ?OT Frequency:   ?  ? ?Co-evaluation   ?  ?  ?  ?  ? ?  ?AM-PAC OT "6 Clicks" Daily Activity     ?Outcome Measure Help from another person eating meals?: None ?Help from another person taking care of personal grooming?: None ?Help from another person toileting, which includes using toliet, bedpan, or urinal?: None ?Help from another person bathing (including washing, rinsing, drying)?: None ?Help from another person to put on and taking off regular upper body clothing?: None ?Help from another person to put on and taking off regular lower body clothing?: None ?6 Click Score: 24 ?  ?End of Session Nurse Communication: Other (comment) (ok to participate) ? ?Activity Tolerance: Patient tolerated treatment well ?Patient left: in bed;with call bell/phone within reach ? ?OT Visit Diagnosis: Unsteadiness on feet (R26.81)  ?              ?Time: 4098-1191 ?OT Time Calculation (min): 11 min ?Charges:  OT General Charges ?$OT Visit: 1 Visit ?OT Evaluation ?$OT Eval Low Complexity: 1 Low ? ?Laquisha Northcraft OTR/L, MS ?Acute Rehabilitation Department ?Office# [phone] ?Pager# [phone] ? ? ?Barnabas Lister Danesha Kirchoff ?01/06/2022, 12:00 PM ?

## 2022-01-07 DIAGNOSIS — K56609 Unspecified intestinal obstruction, unspecified as to partial versus complete obstruction: Secondary | ICD-10-CM | POA: Diagnosis not present

## 2022-01-07 LAB — COMPREHENSIVE METABOLIC PANEL
ALT: 14 U/L (ref 0–44)
AST: 15 U/L (ref 15–41)
Albumin: 2.5 g/dL — ABNORMAL LOW (ref 3.5–5.0)
Alkaline Phosphatase: 41 U/L (ref 38–126)
Anion gap: 7 (ref 5–15)
BUN: 7 mg/dL (ref 6–20)
CO2: 20 mmol/L — ABNORMAL LOW (ref 22–32)
Calcium: 8 mg/dL — ABNORMAL LOW (ref 8.9–10.3)
Chloride: 106 mmol/L (ref 98–111)
Creatinine, Ser: 0.47 mg/dL (ref 0.44–1.00)
GFR, Estimated: >60 mL/min (ref >60)
Glucose, Bld: 95 mg/dL (ref 70–99)
Potassium: 3.5 mmol/L (ref 3.5–5.1)
Sodium: 133 mmol/L — ABNORMAL LOW (ref 135–145)
Total Bilirubin: 0.6 mg/dL (ref 0.3–1.2)
Total Protein: 5.8 g/dL — ABNORMAL LOW (ref 6.5–8.1)

## 2022-01-07 LAB — CBC WITH DIFFERENTIAL/PLATELET
Abs Immature Granulocytes: 0.49 10*3/uL — ABNORMAL HIGH (ref 0.00–0.07)
Basophils Absolute: 0.1 10*3/uL (ref 0.0–0.1)
Basophils Relative: 1 %
Eosinophils Absolute: 0.2 10*3/uL (ref 0.0–0.5)
Eosinophils Relative: 4 %
HCT: 32.2 % — ABNORMAL LOW (ref 36.0–46.0)
Hemoglobin: 10.5 g/dL — ABNORMAL LOW (ref 12.0–15.0)
Immature Granulocytes: 8 %
Lymphocytes Relative: 14 %
Lymphs Abs: 0.9 10*3/uL (ref 0.7–4.0)
MCH: 28.2 pg (ref 26.0–34.0)
MCHC: 32.6 g/dL (ref 30.0–36.0)
MCV: 86.3 fL (ref 80.0–100.0)
Monocytes Absolute: 0.9 10*3/uL (ref 0.1–1.0)
Monocytes Relative: 14 %
Neutro Abs: 3.9 10*3/uL (ref 1.7–7.7)
Neutrophils Relative %: 59 %
Platelets: 303 10*3/uL (ref 150–400)
RBC: 3.73 MIL/uL — ABNORMAL LOW (ref 3.87–5.11)
RDW: 13.9 % (ref 11.5–15.5)
WBC: 6.5 10*3/uL (ref 4.0–10.5)
nRBC: 0.3 % — ABNORMAL HIGH (ref 0.0–0.2)

## 2022-01-07 LAB — MAGNESIUM: Magnesium: 1.9 mg/dL (ref 1.7–2.4)

## 2022-01-07 LAB — PHOSPHORUS: Phosphorus: 2.3 mg/dL — ABNORMAL LOW (ref 2.5–4.6)

## 2022-01-07 MED ORDER — METHOCARBAMOL 500 MG PO TABS
500.0000 mg | ORAL_TABLET | Freq: Three times a day (TID) | ORAL | Status: DC
Start: 2022-01-07 — End: 2022-01-08
  Administered 2022-01-07 – 2022-01-08 (×3): 500 mg via ORAL
  Filled 2022-01-07 (×3): qty 1

## 2022-01-07 MED ORDER — HYDROMORPHONE HCL 1 MG/ML IJ SOLN: 0.5000 mg | INTRAMUSCULAR | Status: DC | PRN

## 2022-01-07 MED ORDER — SODIUM PHOSPHATES 45 MMOLE/15ML IV SOLN
15.0000 mmol | Freq: Once | INTRAVENOUS | Status: AC
  Administered 2022-01-07: 15 mmol via INTRAVENOUS
  Filled 2022-01-07: qty 5

## 2022-01-07 MED ORDER — OXYCODONE HCL 5 MG PO TABS
5.0000 mg | ORAL_TABLET | ORAL | Status: DC | PRN
  Administered 2022-01-07 – 2022-01-08 (×3): 5 mg via ORAL
  Filled 2022-01-07 (×3): qty 1

## 2022-01-07 MED ORDER — ACETAMINOPHEN 325 MG PO TABS
650.0000 mg | ORAL_TABLET | Freq: Four times a day (QID) | ORAL | Status: DC
  Administered 2022-01-07 – 2022-01-08 (×4): 650 mg via ORAL
  Filled 2022-01-07 (×4): qty 2

## 2022-01-07 NOTE — Progress Notes (Signed)
Physical Therapy Discharge ?Patient Details ?Name: Lindsay Diaz ?MRN: [id number] ?DOB: 1971/03/28 ?Today's Date: 01/07/2022 ?Time:  -  ?  ? ?Patient discharged from PT services secondary to goals met and no further PT needs identified. ? ?Please see latest therapy progress note for current level of functioning and progress toward goals.   ? ?Progress and discharge plan discussed with patient and/or caregiver: Patient/Caregiver agrees with plan ? ?GP ?  Tresa Endo PT ?Acute Rehabilitation Services ?Pager [phone] ?Office [phone] ? ? ?Shayra Anton, Shella Maxim ?01/07/2022, 7:05 AM  ?

## 2022-01-07 NOTE — Progress Notes (Signed)
? ?PROGRESS NOTE ? ?Lindsay Diaz CXK:[id number] DOB: [birth year] DOA: 01/01/2022 ?PCP: [facility] ? ?HPI/Recap of past 24 hours: ?Patient is a 51 year old female with history of ulcerative colitis status post colostomy, recent SBO with parastomal hernia, bowel obstruction, hypertension, anxiety/depression who presented with recurrent abdominal pain, nausea, vomiting.  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  Patient admitted for the management of SBO.  She did not improve with conservative management.  Post exploratory laparotomy by general surgery on 01/04/22.  She pulled out her NG tube on 01/06/2022.  On 01/07/2022 endorses flatulence and stools from her colostomy bag.  Started on clear liquid diet per surgery to advance to full diet as tolerated. ? ?01/07/2022: Seen and examined at bedside.  There were no acute events overnight.  She has no new complaints.  She wants a diet. ? ? ?Assessment/Plan: ?Principal Problem: ?  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 ?Active Problems: ?  HTN (hypertension) ?  Morbid obesity with body mass index (BMI) of 40.0 to 49.9 (HCC) ?  Hypokalemia ? ?Recurrent SBO status post exploratory laparotomy by general surgery on 01/04/2022:  ?Presented with abdominal pain, nausea, vomiting.  ?Imaging showed SBO on presentation.   ?NG tube placed, and pulled out on 01/06/2022.   ?On 01/07/2022 endorses flatulence and stools from her colostomy bag.  Started on clear liquid diet per surgery to advance to full diet as tolerated. ?Continue IV fluid hydration and replacement of electrolytes ?Optimize magnesium and potassium levels ?Goal magnesium greater than 2.0 ?Goal potassium greater than 4.0. ?Continue pain control. ? ?Non anion gap metabolic acidosis ?Serum bicarb 29 anion gap of 7 ?Continue gentle IV fluid hydration ?Repeat renal panel in the morning ? ?Resolved post repletion: Hypokalemia ?Serum potassium 3.2>>  3.5. ?Repleted intravenously ?Continue D5 LR KCl 40 mEq at 75 cc/h. ?We will repeat chemistry panel in the morning. ? ?Refractory hypophosphatemia ?Phosphorus 1.4>> 2.3 ?Repleted intravenously ?  ?Resolved suspected aspiration pneumonia:  ?Respiratory status is stable.  Not hypoxic.   ?Continue to monitor off antibiotics. ?  ?Resolved leukocytosis:  ?  ?Hypertension:  ?Blood pressure stable. ?Continue to monitor vital signs. ? ?Anxiety/depression: ?Stable ?Continue home bupropion, citalopram ?  ?Morbid obesity:  ?BMI of 40.7 recommend weight loss outpatient with regular physical activity and healthy dieting. ?  ?  ?  ?  ?DVT prophylaxis:enoxaparin (LOVENOX) injection 40 mg Start: 01/01/22 2200 ?  ?  ?  Code Status: Full Code ?  ?Family Communication: None at bedside. ?  ?Patient status:Inpatient ?  ?Patient is from :Home ?  ?Anticipated discharge HF:WYOV when general surgery ?2. ?  ?Estimated DC date: We will discharge once general surgery signs off and the patient is hemodynamically stable. ?  ?  ?Consultants: Surgery ?  ?Procedures:None ?  ?Antimicrobials:  ?Anti-infectives (From admission, onward)  ?  ?  ?  Start     Dose/Rate Route Frequency Ordered Stop  ?  01/04/22 0830   ceFAZolin (ANCEF) IVPB 2g/100 mL premix       ? 2 g ?200 mL/hr over 30 Minutes Intravenous  Once 01/04/22 0734 01/04/22 0940  ?  ?   ?  ?  ?  ? ? ? ?Status is: Inpatient ?Patient requires at least 2 midnights for further evaluation and treatment of present condition. ? ? ? ?Objective: ?Vitals:  ? 01/06/22 7858 01/06/22 1239 01/06/22 2218 01/07/22 0510  ?BP: 138/80 (!) 148/78 128/71 108/62  ?Pulse: 100 98 (!) 106 94  ?  Resp: 18 16 20 18   ?Temp: 98.2 ?F (36.8 ?C) 98.7 ?F (37.1 ?C) 100.1 ?F (37.8 ?C) 99 ?F (37.2 ?C)  ?TempSrc:  Oral Oral Oral  ?SpO2: 100% 99% 98% 100%  ?Weight:      ?Height:      ? ? ?Intake/Output Summary (Last 24 hours) at 01/07/2022 1416 ?Last data filed at 01/07/2022 0600 ?Gross per 24 hour  ?Intake 2232.5 ml  ?Output 1170 ml   ?Net 1062.5 ml  ? ?Filed Weights  ? 01/01/22 0739 01/04/22 1313  ?Weight: 117.9 kg 117 kg  ? ? ?Exam: ? ?General: 51 y.o. year-old female well-developed well-nourished no acute distress.  She is alert and oriented x3.   ?Cardiovascular: Regular rate and rhythm no rubs or gallops. ?Respiratory: Clear to auscultation no wheezes or rales. ?Abdomen: Bowel sounds present.  Brown stool present in colostomy bag ?Musculoskeletal: No lower extremity edema. ?Skin: No ulcerative lesions. ?Psychiatry: Mood is appropriate for condition. ? ? ?Data Reviewed: ?CBC: ?Recent Labs  ?Lab 01/01/22 ?0750 01/02/22 ?[id number] 01/05/22 ?[id number] 01/07/22 ?[id number]  ?WBC 12.9* 6.9 5.6 6.5  ?NEUTROABS  --   --   --  3.9  ?HGB 14.3 13.1 10.7* 10.5*  ?HCT 43.7 40.9 34.2* 32.2*  ?MCV 86.5 86.8 89.1 86.3  ?PLT 463* 440* 265 303  ? ?Basic Metabolic Panel: ?Recent Labs  ?Lab 01/03/22 ?0402 01/04/22 ?0413 01/05/22 ?[id number] 01/06/22 ?[id number] 01/07/22 ?0740  ?NA 140 139 139 140 133*  ?K 3.5 3.1* 3.8 3.2* 3.5  ?CL 112* 112* 112* 112* 106  ?CO2 20* 20* 21* 20* 20*  ?GLUCOSE 98 118* 114* 82 95  ?BUN 24* 14 13 13 7   ?CREATININE 0.81 0.67 0.56 0.59 0.47  ?CALCIUM 8.8* 8.4* 7.9* 8.3* 8.0*  ?MG  --   --   --  2.3 1.9  ?PHOS  --   --   --  1.4* 2.3*  ? ?GFR: ?Estimated Creatinine Clearance: 108.8 mL/min (by C-G formula based on SCr of 0.47 mg/dL). ?Liver Function Tests: ?Recent Labs  ?Lab 01/01/22 ?0750 01/06/22 ?0358 01/07/22 ?0740  ?AST 22  --  15  ?ALT 18  --  14  ?ALKPHOS 68  --  41  ?BILITOT 0.6  --  0.6  ?PROT 8.1  --  5.8*  ?ALBUMIN 4.3 2.5* 2.5*  ? ?Recent Labs  ?Lab 01/01/22 ?0750  ?LIPASE 37  ? ?No results for input(s): AMMONIA in the last 168 hours. ?Coagulation Profile: ?No results for input(s): INR, PROTIME in the last 168 hours. ?Cardiac Enzymes: ?No results for input(s): CKTOTAL, CKMB, CKMBINDEX, TROPONINI in the last 168 hours. ?BNP (last 3 results) ?No results for input(s): PROBNP in the last 8760 hours. ?HbA1C: ?No results for input(s): HGBA1C in the last 72  hours. ?CBG: ?No results for input(s): GLUCAP in the last 168 hours. ?Lipid Profile: ?No results for input(s): CHOL, HDL, LDLCALC, TRIG, CHOLHDL, LDLDIRECT in the last 72 hours. ?Thyroid Function Tests: ?No results for input(s): TSH, T4TOTAL, FREET4, T3FREE, THYROIDAB in the last 72 hours. ?Anemia Panel: ?No results for input(s): VITAMINB12, FOLATE, FERRITIN, TIBC, IRON, RETICCTPCT in the last 72 hours. ?Urine analysis: ?   ?Component Value Date/Time  ? COLORURINE YELLOW 01/01/2022 1430  ? APPEARANCEUR HAZY (A) 01/01/2022 1430  ? LABSPEC <1.005 (L) 01/01/2022 1430  ? PHURINE 5.0 01/01/2022 1430  ? GLUCOSEU NEGATIVE 01/01/2022 1430  ? HGBUR SMALL (A) 01/01/2022 1430  ? BILIRUBINUR NEGATIVE 01/01/2022 1430  ? KETONESUR NEGATIVE 01/01/2022 1430  ? PROTEINUR 30 (A) 01/01/2022 1430  ?  NITRITE NEGATIVE 01/01/2022 1430  ? LEUKOCYTESUR NEGATIVE 01/01/2022 1430  ? ?Sepsis Labs: ?@[email or web address] (procalcitonin:4,lacticidven:4) ? ?) ?Recent Results (from the past 240 hour(s))  ?Surgical PCR screen     Status: None  ? Collection Time: 01/04/22  3:49 AM  ? Specimen: Nasal Mucosa; Nasal Swab  ?Result Value Ref Range Status  ? MRSA, PCR NEGATIVE NEGATIVE Final  ? Staphylococcus aureus NEGATIVE NEGATIVE Final  ?  Comment: (NOTE) ?The Xpert SA Assay (FDA approved for NASAL specimens in patients 52 ?years of age and older), is one component of a comprehensive ?surveillance program. It is not intended to diagnose infection nor to ?guide or monitor treatment. ?Performed at [facility], [address] ?[place], [place] ?  ?  ? ? ?Studies: ?No results found. ? ?Scheduled Meds: ? Chlorhexidine Gluconate Cloth  6 each Topical Daily  ? enoxaparin (LOVENOX) injection  40 mg Subcutaneous Q24H  ? pantoprazole (PROTONIX) IV  40 mg Intravenous Q24H  ? ? ?Continuous Infusions: ? dextrose 5 % lactated ringers with kcl 75 mL/hr at 01/06/22 0604  ? methocarbamol (ROBAXIN) IV    ? promethazine (PHENERGAN) injection (IM or  IVPB) 12.5 mg (01/03/22 0452)  ? ? ? LOS: 6 days  ? ? ? ?01/05/22, MD ?[facility] ?Pager [phone] ? ?If 7PM-7AM, please contact night-coverage [email or web address] ?Password [email or web address] ?01/07/2022, 2:16 PM  ?  ?

## 2022-01-07 NOTE — Discharge Instructions (Signed)
[facility]      [facility] °[phone] ° °OPEN ABDOMINAL SURGERY: POST OP INSTRUCTIONS ° °Always review your discharge instruction sheet given to you by the facility where your surgery was performed. ° °IF YOU HAVE DISABILITY OR FAMILY LEAVE FORMS, YOU MUST BRING THEM TO THE OFFICE FOR PROCESSING.  PLEASE DO NOT GIVE THEM TO YOUR DOCTOR. ° °A prescription for pain medication may be given to you upon discharge.  Take your pain medication as prescribed, if needed.  If narcotic pain medicine is not needed, then you may take acetaminophen (Tylenol) or ibuprofen (Advil) as needed. °Take your usually prescribed medications unless otherwise directed. °If you need a refill on your pain medication, please contact your pharmacy. They will contact our office to request authorization.  Prescriptions will not be filled after 5pm or on week-ends. °You should follow a light diet the first few days after arrival home, such as soup and crackers, pudding, etc.unless your doctor has advised otherwise. A high-fiber, low fat diet can be resumed as tolerated.   Be sure to include lots of fluids daily. Most patients will experience some swelling and bruising on the chest and neck area.  Ice packs will help.  Swelling and bruising can take several days to resolve °Most patients will experience some swelling and bruising in the area of the incision. Ice pack will help. Swelling and bruising can take several days to resolve..  °It is common to experience some constipation if taking pain medication after surgery.  Increasing fluid intake and taking a stool softener will usually help or prevent this problem from occurring.  A mild laxative (Milk of Magnesia or Miralax) should be taken according to package directions if there are no bowel movements after 48 hours. ° You may have steri-strips (small skin tapes) in place directly over the incision.  These strips should be left on the skin for 7-10 days.  If your surgeon used skin  glue on the incision, you may shower in 24 hours.  The glue will flake off over the next 2-3 weeks.  Any sutures or staples will be removed at the office during your follow-up visit. You may find that a light gauze bandage over your incision may keep your staples from being rubbed or pulled. You may shower and replace the bandage daily. °ACTIVITIES:  You may resume regular (light) daily activities beginning the next day--such as daily self-care, walking, climbing stairs--gradually increasing activities as tolerated.  You may have sexual intercourse when it is comfortable.  Refrain from any heavy lifting or straining until approved by your doctor. °You may drive when you no longer are taking prescription pain medication, you can comfortably wear a seatbelt, and you can safely maneuver your car and apply brakes ° °You should see your doctor in the office for a follow-up appointment approximately two weeks after your surgery.  Make sure that you call for this appointment within a day or two after you arrive home to insure a convenient appointment time. ° °WHEN TO CALL YOUR DOCTOR: °Fever over 101.0 °Inability to urinate °Nausea and/or vomiting °Extreme swelling or bruising °Continued bleeding from incision. °Increased pain, redness, or drainage from the incision. °Difficulty swallowing or breathing °Muscle cramping or spasms. °Numbness or tingling in hands or feet or around lips. ° °The clinic staff is available to answer your questions during regular business hours.  Please don’t hesitate to call and ask to speak to one of the nurses if you have concerns. ° °For   further questions, please visit [email or web address]  °

## 2022-01-07 NOTE — Progress Notes (Signed)
3 Days Post-Op  ? ?Subjective/Chief Complaint: ?Pt looks good today ?Ostomy productive ?No n/v ? ? ?Objective: ?Vital signs in last 24 hours: ?Temp:  [98.7 ?F (37.1 ?C)-100.1 ?F (37.8 ?C)] 99 ?F (37.2 ?C) (05/01 0510) ?Pulse Rate:  [94-106] 94 (05/01 0510) ?Resp:  [16-20] 18 (05/01 0510) ?BP: (108-148)/(62-78) 108/62 (05/01 0510) ?SpO2:  [98 %-100 %] 100 % (05/01 0510) ?Last BM Date : 01/01/22 ? ?Intake/Output from previous day: ?04/30 0701 - 05/01 0700 ?In: 2232.5 [I.V.:1732.5; IV Piggyback:500] ?Out: 1970 [Urine:1550; Emesis/NG output:300; Stool:120] ?Intake/Output this shift: ?No intake/output data recorded. ? ?General appearance: alert and cooperative ?GI: soft, non-tender; bowel sounds normal; no masses,  no organomegaly and soft, nttp, midline c/d/i ? ?Lab Results:  ?Recent Labs  ?  01/05/22 ?0348 01/07/22 ?0621  ?WBC 5.6 6.5  ?HGB 10.7* 10.5*  ?HCT 34.2* 32.2*  ?PLT 265 303  ? ?BMET ?Recent Labs  ?  01/05/22 ?0348 01/06/22 ?0358  ?NA 139 140  ?K 3.8 3.2*  ?CL 112* 112*  ?CO2 21* 20*  ?GLUCOSE 114* 82  ?BUN 13 13  ?CREATININE 0.56 0.59  ?CALCIUM 7.9* 8.3*  ? ?PT/INR ?No results for input(s): LABPROT, INR in the last 72 hours. ?ABG ?No results for input(s): PHART, HCO3 in the last 72 hours. ? ?Invalid input(s): PCO2, PO2 ? ?Studies/Results: ?DG Abd Portable 1V ? ?Result Date: 01/06/2022 ?CLINICAL DATA:  Small bowel obstruction. Patient reports NG tube came out this morning. EXAM: PORTABLE ABDOMEN - 1 VIEW COMPARISON:  Radiograph 01/03/2022 FINDINGS: The enteric tube has been removed. Midline skin staples consistent with interval surgery. There is gaseous distention of bowel in the upper abdomen, likely combination of large and small bowel. Small bowel distention up to 4.4 cm. Multiple gallstones. IMPRESSION: 1. Gaseous distention of bowel in the upper abdomen likely representing both small and large bowel, postoperative ileus versus obstruction. 2. Multiple gallstones. Electronically Signed   By: Narda Rutherford M.D.   On: 01/06/2022 12:27   ? ?Anti-infectives: ?Anti-infectives (From admission, onward)  ? ? Start     Dose/Rate Route Frequency Ordered Stop  ? 01/04/22 1224  ceFAZolin (ANCEF) 2-4 GM/100ML-% IVPB       ?Note to Pharmacy: Kandice Hams D: cabinet override  ?    01/04/22 1224 01/04/22 1313  ? 01/04/22 0830  ceFAZolin (ANCEF) IVPB 2g/100 mL premix       ? 2 g ?200 mL/hr over 30 Minutes Intravenous  Once 01/04/22 0734 01/04/22 1910  ? ?  ? ? ?Assessment/Plan: ?s/p Procedure(s): ?EXPLORATORY LAPAROTOMY, HERNIA REPAIR PARASTOMAL (N/A) ?Advance diet to fulls ?Mobilize ?Wound care ? LOS: 6 days  ? ? ?Axel Filler ?01/07/2022 ? ?

## 2022-01-08 DIAGNOSIS — K56609 Unspecified intestinal obstruction, unspecified as to partial versus complete obstruction: Secondary | ICD-10-CM | POA: Diagnosis not present

## 2022-01-08 LAB — CBC WITH DIFFERENTIAL/PLATELET
Abs Immature Granulocytes: 0.82 10*3/uL — ABNORMAL HIGH (ref 0.00–0.07)
Basophils Absolute: 0.1 10*3/uL (ref 0.0–0.1)
Basophils Relative: 1 %
Eosinophils Absolute: 0.3 10*3/uL (ref 0.0–0.5)
Eosinophils Relative: 4 %
HCT: 33.7 % — ABNORMAL LOW (ref 36.0–46.0)
Hemoglobin: 10.6 g/dL — ABNORMAL LOW (ref 12.0–15.0)
Immature Granulocytes: 11 %
Lymphocytes Relative: 13 %
Lymphs Abs: 1 10*3/uL (ref 0.7–4.0)
MCH: 27.7 pg (ref 26.0–34.0)
MCHC: 31.5 g/dL (ref 30.0–36.0)
MCV: 88.2 fL (ref 80.0–100.0)
Monocytes Absolute: 1 10*3/uL (ref 0.1–1.0)
Monocytes Relative: 13 %
Neutro Abs: 4.4 10*3/uL (ref 1.7–7.7)
Neutrophils Relative %: 58 %
Platelets: 261 10*3/uL (ref 150–400)
RBC: 3.82 MIL/uL — ABNORMAL LOW (ref 3.87–5.11)
RDW: 13.8 % (ref 11.5–15.5)
WBC: 7.5 10*3/uL (ref 4.0–10.5)
nRBC: 0 % (ref 0.0–0.2)

## 2022-01-08 LAB — COMPREHENSIVE METABOLIC PANEL
ALT: 17 U/L (ref 0–44)
AST: 16 U/L (ref 15–41)
Albumin: 2.6 g/dL — ABNORMAL LOW (ref 3.5–5.0)
Alkaline Phosphatase: 41 U/L (ref 38–126)
Anion gap: 5 (ref 5–15)
BUN: <5 mg/dL — ABNORMAL LOW (ref 6–20)
CO2: 24 mmol/L (ref 22–32)
Calcium: 8.1 mg/dL — ABNORMAL LOW (ref 8.9–10.3)
Chloride: 107 mmol/L (ref 98–111)
Creatinine, Ser: 0.51 mg/dL (ref 0.44–1.00)
GFR, Estimated: >60 mL/min (ref >60)
Glucose, Bld: 108 mg/dL — ABNORMAL HIGH (ref 70–99)
Potassium: 3.3 mmol/L — ABNORMAL LOW (ref 3.5–5.1)
Sodium: 136 mmol/L (ref 135–145)
Total Bilirubin: 0.4 mg/dL (ref 0.3–1.2)
Total Protein: 5.6 g/dL — ABNORMAL LOW (ref 6.5–8.1)

## 2022-01-08 LAB — PHOSPHORUS: Phosphorus: 4.3 mg/dL (ref 2.5–4.6)

## 2022-01-08 LAB — MAGNESIUM: Magnesium: 1.9 mg/dL (ref 1.7–2.4)

## 2022-01-08 MED ORDER — GUAIFENESIN-DM 100-10 MG/5ML PO SYRP
5.0000 mL | ORAL_SOLUTION | ORAL | Status: DC | PRN
  Administered 2022-01-08 (×2): 5 mL via ORAL
  Filled 2022-01-08 (×2): qty 10

## 2022-01-08 MED ORDER — PANTOPRAZOLE SODIUM 40 MG PO TBEC: 40.0000 mg | DELAYED_RELEASE_TABLET | Freq: Every day | ORAL | 0 refills | Status: AC

## 2022-01-08 MED ORDER — POTASSIUM CHLORIDE CRYS ER 20 MEQ PO TBCR
40.0000 meq | EXTENDED_RELEASE_TABLET | ORAL | Status: DC
  Administered 2022-01-08: 40 meq via ORAL
  Filled 2022-01-08: qty 2

## 2022-01-08 MED ORDER — FE FUMARATE-B12-VIT C-FA-IFC PO CAPS: 1.0000 | ORAL_CAPSULE | Freq: Two times a day (BID) | ORAL | 2 refills | Status: AC

## 2022-01-08 MED ORDER — FE FUMARATE-B12-VIT C-FA-IFC PO CAPS
1.0000 | ORAL_CAPSULE | Freq: Two times a day (BID) | ORAL | Status: DC
  Filled 2022-01-08 (×2): qty 1

## 2022-01-08 NOTE — Progress Notes (Signed)
Discharge instructions discussed with patient, verbalized agreement and understanding 

## 2022-01-08 NOTE — Progress Notes (Signed)
4 Days Post-Op  ? ?Subjective/Chief Complaint: ?Doing well this AM ?Tol fulls ?Cont' with bowel fxn ? ? ?Objective: ?Vital signs in last 24 hours: ?Temp:  [98.3 ?F (36.8 ?C)-98.9 ?F (37.2 ?C)] 98.9 ?F (37.2 ?C) (05/02 3295) ?Pulse Rate:  [97-106] 97 (05/02 0616) ?Resp:  [16-18] 18 (05/02 1884) ?BP: (133-149)/(73-93) 149/93 (05/02 1660) ?SpO2:  [99 %-100 %] 99 % (05/02 0616) ?Last BM Date : 01/07/22 ? ?Intake/Output from previous day: ?05/01 0701 - 05/02 0700 ?In: 2212.3 [P.O.:600; I.V.:1360; IV Piggyback:252.3] ?Out: -  ?Intake/Output this shift: ?No intake/output data recorded. ? ?General appearance: alert and cooperative ?GI: soft, non-tender; bowel sounds normal; no masses,  no organomegaly and soft, nttp, midline c/d/i ? ?Lab Results:  ?Recent Labs  ?  01/07/22 ?0621 01/08/22 ?0435  ?WBC 6.5 7.5  ?HGB 10.5* 10.6*  ?HCT 32.2* 33.7*  ?PLT 303 261  ? ?BMET ?Recent Labs  ?  01/07/22 ?0740 01/08/22 ?0435  ?NA 133* 136  ?K 3.5 3.3*  ?CL 106 107  ?CO2 20* 24  ?GLUCOSE 95 108*  ?BUN 7 <5*  ?CREATININE 0.47 0.51  ?CALCIUM 8.0* 8.1*  ? ?PT/INR ?No results for input(s): LABPROT, INR in the last 72 hours. ?ABG ?No results for input(s): PHART, HCO3 in the last 72 hours. ? ?Invalid input(s): PCO2, PO2 ? ?Studies/Results: ?DG Abd Portable 1V ? ?Result Date: 01/06/2022 ?CLINICAL DATA:  Small bowel obstruction. Patient reports NG tube came out this morning. EXAM: PORTABLE ABDOMEN - 1 VIEW COMPARISON:  Radiograph 01/03/2022 FINDINGS: The enteric tube has been removed. Midline skin staples consistent with interval surgery. There is gaseous distention of bowel in the upper abdomen, likely combination of large and small bowel. Small bowel distention up to 4.4 cm. Multiple gallstones. IMPRESSION: 1. Gaseous distention of bowel in the upper abdomen likely representing both small and large bowel, postoperative ileus versus obstruction. 2. Multiple gallstones. Electronically Signed   By: Narda Rutherford M.D.   On: 01/06/2022 12:27    ? ?Anti-infectives: ?Anti-infectives (From admission, onward)  ? ? Start     Dose/Rate Route Frequency Ordered Stop  ? 01/04/22 1224  ceFAZolin (ANCEF) 2-4 GM/100ML-% IVPB       ?Note to Pharmacy: Kandice Hams D: cabinet override  ?    01/04/22 1224 01/04/22 1313  ? 01/04/22 0830  ceFAZolin (ANCEF) IVPB 2g/100 mL premix       ? 2 g ?200 mL/hr over 30 Minutes Intravenous  Once 01/04/22 0734 01/04/22 1910  ? ?  ? ? ?Assessment/Plan: ?s/p Procedure(s): ?EXPLORATORY LAPAROTOMY, HERNIA REPAIR PARASTOMAL (N/A) ?Advance diet as tol ?Hopefully home today if able to tol PO ?F/u in chart ? ? LOS: 7 days  ? ? ?Lindsay Diaz ?01/08/2022 ? ?

## 2022-01-08 NOTE — Discharge Summary (Signed)
?Physician Discharge Summary ?  ?Patient: Lindsay SimmondsCheri Diaz MRN: [id number] DOB: 02-15-1971  ?Admit date:     01/01/2022  ?Discharge date: 01/08/22  ?Discharge Physician: Arnetha CourserSumayya Ruari Mudgett  ? ?PCP: [facility]  ? ?Recommendations at discharge:  ?Please obtain CBC and BMP in 1 week ?Patient is being discharged with colostomy. ?Follow-up with general surgery ?Follow-up with PCP ? ?Discharge Diagnoses: ?Principal Problem: ?  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 ?Active Problems: ?  HTN (hypertension) ?  Morbid obesity with body mass index (BMI) of 40.0 to 49.9 (HCC) ?  Hypokalemia ? ?Hospital Course: ?Patient is a 51 year old female with history of ulcerative colitis status post colostomy, recent SBO with parastomal hernia, bowel obstruction, hypertension, anxiety/depression who presented with recurrent abdominal pain, nausea, vomiting.  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  Patient admitted for the management of SBO.  She did not improve with conservative management.  Post exploratory laparotomy by general surgery on 01/04/22. ?NG tube was removed on 01/06/2022.Marland Kitchen. ?Started getting flatulence and stool in colostomy bag on 01/07/2022. ?Diet was slowly advanced and she was able to tolerate soft diet without any difficulty before discharge. ? ?Patient will need to have potassium above 4 and magnesium above 2, it was repleted as needed water during her stay in the hospital. ? ?Hypophosphatemia was also corrected with repletion before discharge. ? ?There was also concern of aspiration pneumonia and she completed the course of antibiotics. ? ?Patient will continue the rest of her home medications and follow-up with her providers. ? ?Consultants: General surgery ?Procedures performed: Exploratory laparotomy and colostomy ?Disposition: Home ?Diet recommendation:  ?Discharge Diet Orders (From admission, onward)  ? ?  Start     Ordered  ? 01/08/22 0000  Diet -  low sodium heart healthy       ? 01/08/22 1445  ? ?  ?  ? ?  ? ?Regular diet ?DISCHARGE MEDICATION: ?Allergies as of 01/08/2022   ? ?   Reactions  ? Zofran [ondansetron] Nausea And Vomiting  ? ?  ? ?  ?Medication List  ?  ? ?STOP taking these medications   ? ?oxyCODONE 5 MG immediate release tablet ?Commonly known as: Oxy IR/ROXICODONE ?  ? ?  ? ?TAKE these medications   ? ?acetaminophen 500 MG tablet ?Commonly known as: TYLENOL ?Take 2 tablets (1,000 mg total) by mouth every 6 (six) hours as needed. ?  ?atenolol 50 MG tablet ?Commonly known as: TENORMIN ?Take 50 mg by mouth at bedtime. ?  ?buPROPion 150 MG 12 hr tablet ?Commonly known as: WELLBUTRIN SR ?Take 150 mg by mouth daily. ?  ?citalopram 40 MG tablet ?Commonly known as: CELEXA ?Take 40 mg by mouth daily. ?  ?ferrous fumarate-b12-vitamic C-folic acid capsule ?Commonly known as: TRINSICON / FOLTRIN ?Take 1 capsule by mouth 2 (two) times daily after a meal. ?  ?methocarbamol 500 MG tablet ?Commonly known as: ROBAXIN ?Take 1-2 tablets (500-1,000 mg total) by mouth every 8 (eight) hours as needed for muscle spasms. ?  ?pantoprazole 40 MG tablet ?Commonly known as: PROTONIX ?Take 1 tablet (40 mg total) by mouth daily. ?  ?Turmeric 500 MG Caps ?Take 500 mg by mouth at bedtime. ?  ?Vitamin D-3 125 MCG (5000 UT) Tabs ?Take 5,000 Units by mouth at bedtime. ?  ?zolpidem 10 MG tablet ?Commonly known as: AMBIEN ?Take 10 mg by mouth at bedtime. ?  ? ?  ? ?  ?  ? ? ?  ?Discharge  Care Instructions  ?(From admission, onward)  ?  ? ? ?  ? ?  Start     Ordered  ? 01/08/22 0000  Leave dressing on - Keep it clean, dry, and intact until clinic visit       ? 01/08/22 1445  ? ?  ?  ? ?  ? ? Follow-up Information   ? ? [facility]. Go on 01/18/2022.   ?Specialty: General Surgery ?Why: 9:00 For staple removal. This will be an RN visit, please arrive 15 min early to check in. ?Contact information: ?[address] ?[address] ?[place] [address] ?[phone] ? ? ?  ?  ? ?  Maczis, Puja Gosai, PA-C. Go on 02/05/2022.   ?Specialty: General Surgery ?Why: 8:45 AM for post-op visit. Please arrive 15 min prior to check in. Have ID and insurance with you. ?Contact information: ?[address] ?[address] ?[place] [address] ?[phone] ? ? ?  ?  ? ? [facility]. Schedule an appointment as soon as possible for a visit in 1 week(s).   ?Specialty: Family Medicine ?Contact information: ?[address] ?[address] ?[place] [address] ?[phone] ? ? ?  ?  ? ?  ?  ? ?  ? ?Discharge Exam: ?Filed Weights  ? 01/01/22 0739 01/04/22 1313  ?Weight: 117.9 kg 117 kg  ? ?General.     In no acute distress. ?Pulmonary.  Lungs clear bilaterally, normal respiratory effort. ?CV.  Regular rate and rhythm, no JVD, rub or murmur. ?Abdomen.  Soft, nontender, nondistended, BS positive. ?CNS.  Alert and oriented x3.  No focal neurologic deficit. ?Extremities.  No edema, no cyanosis, pulses intact and symmetrical. ?Psychiatry.  Judgment and insight appears normal.  ? ?Condition at discharge: stable ? ?The results of significant diagnostics from this hospitalization (including imaging, microbiology, ancillary and laboratory) are listed below for reference.  ? ?Imaging Studies: ?DG Abd 1 View ? ?Result Date: 01/01/2022 ?CLINICAL DATA:  Evaluate NG placement. EXAM: ABDOMEN - 1 VIEW COMPARISON:  01/01/2022. FINDINGS: The bowel gas pattern is normal, the mid to lower abdomen is obscured from the field of view. An enteric tube terminates in the stomach and appears appropriate position. Multiple calcifications are noted of the right upper quadrant, compatible with cholelithiasis. IMPRESSION: 1. The enteric tube terminates in the stomach and is appropriate in position. 2. Nonobstructive bowel-gas pattern, however the mid to lower abdomen is obscured from the field of view. 3. Cholelithiasis. Electronically Signed   By: Thornell Sartorius M.D.   On: 01/01/2022 19:59  ? ?DG Abdomen 1  View ? ?Result Date: 01/01/2022 ?CLINICAL DATA:  NG tube placement EXAM: ABDOMEN - 1 VIEW COMPARISON:  None. FINDINGS: Esophagogastric tube with tip below the diaphragm, side port level gastroesophageal junction. Nonobstructive pattern of included bowel gas. IMPRESSION: Esophagogastric tube with tip below the diaphragm, side port level gastroesophageal junction. Recommend advancement to ensure subdiaphragmatic position of both tip and side port. Electronically Signed   By: Jearld Lesch M.D.   On: 01/01/2022 11:33  ? ?CT ABDOMEN PELVIS W CONTRAST ? ?Result Date: 01/01/2022 ?CLINICAL DATA:  Abdominal pain EXAM: CT ABDOMEN AND PELVIS WITH CONTRAST TECHNIQUE: Multidetector CT imaging of the abdomen and pelvis was performed using the standard protocol following bolus administration of intravenous contrast. RADIATION DOSE REDUCTION: This exam was performed according to the departmental dose-optimization program which includes automated exposure control, adjustment of the mA and/or kV according to patient size and/or use of  iterative reconstruction technique. CONTRAST:  OMNIPAQUE IOHEXOL 300 MG/ML  SOLN COMPARISON:  CT abdomen and pelvis dated November 10, 2021 FINDINGS: Lower chest: Numerous centrilobular nodules. Patulous and fluid-filled esophagus. Hepatobiliary: No suspicious liver lesions. Distended gallbladder with no wall thickening. Cholelithiasis. No biliary ductal dilation. Pancreas: Unremarkable. No pancreatic ductal dilatation or surrounding inflammatory changes. Spleen: Normal in size without focal abnormality. Adrenals/Urinary Tract: Bilateral adrenal glands are unremarkable. No hydronephrosis. Bilateral nonobstructing renal stones. Bladder is decompressed. Stomach/Bowel: Stomach is normal in appearance. Left lower quadrant colostomy. Numerous dilated loops of small bowel with transition point at left abdominal parastomal hernia dilated loops of small bowel are seen within the hernia. Colon is decompressed.  A small amount of fluid is seen within the hernia sac. No free air or pneumatosis. Vascular/Lymphatic: No significant vascular findings are present. No enlarged abdominal or pelvic lymph nodes. Reproductive: U

## 2023-08-19 IMAGING — DX DG ABD PORTABLE 1V
2 series · 2 of 2 positions shown · non-contrast
Comparison: KUB 01/01/2022

CLINICAL DATA: Small-bowel obstruction, parastomal hernia

EXAM:
PORTABLE ABDOMEN - 1 VIEW

[abdomen kub (1 of 2)]
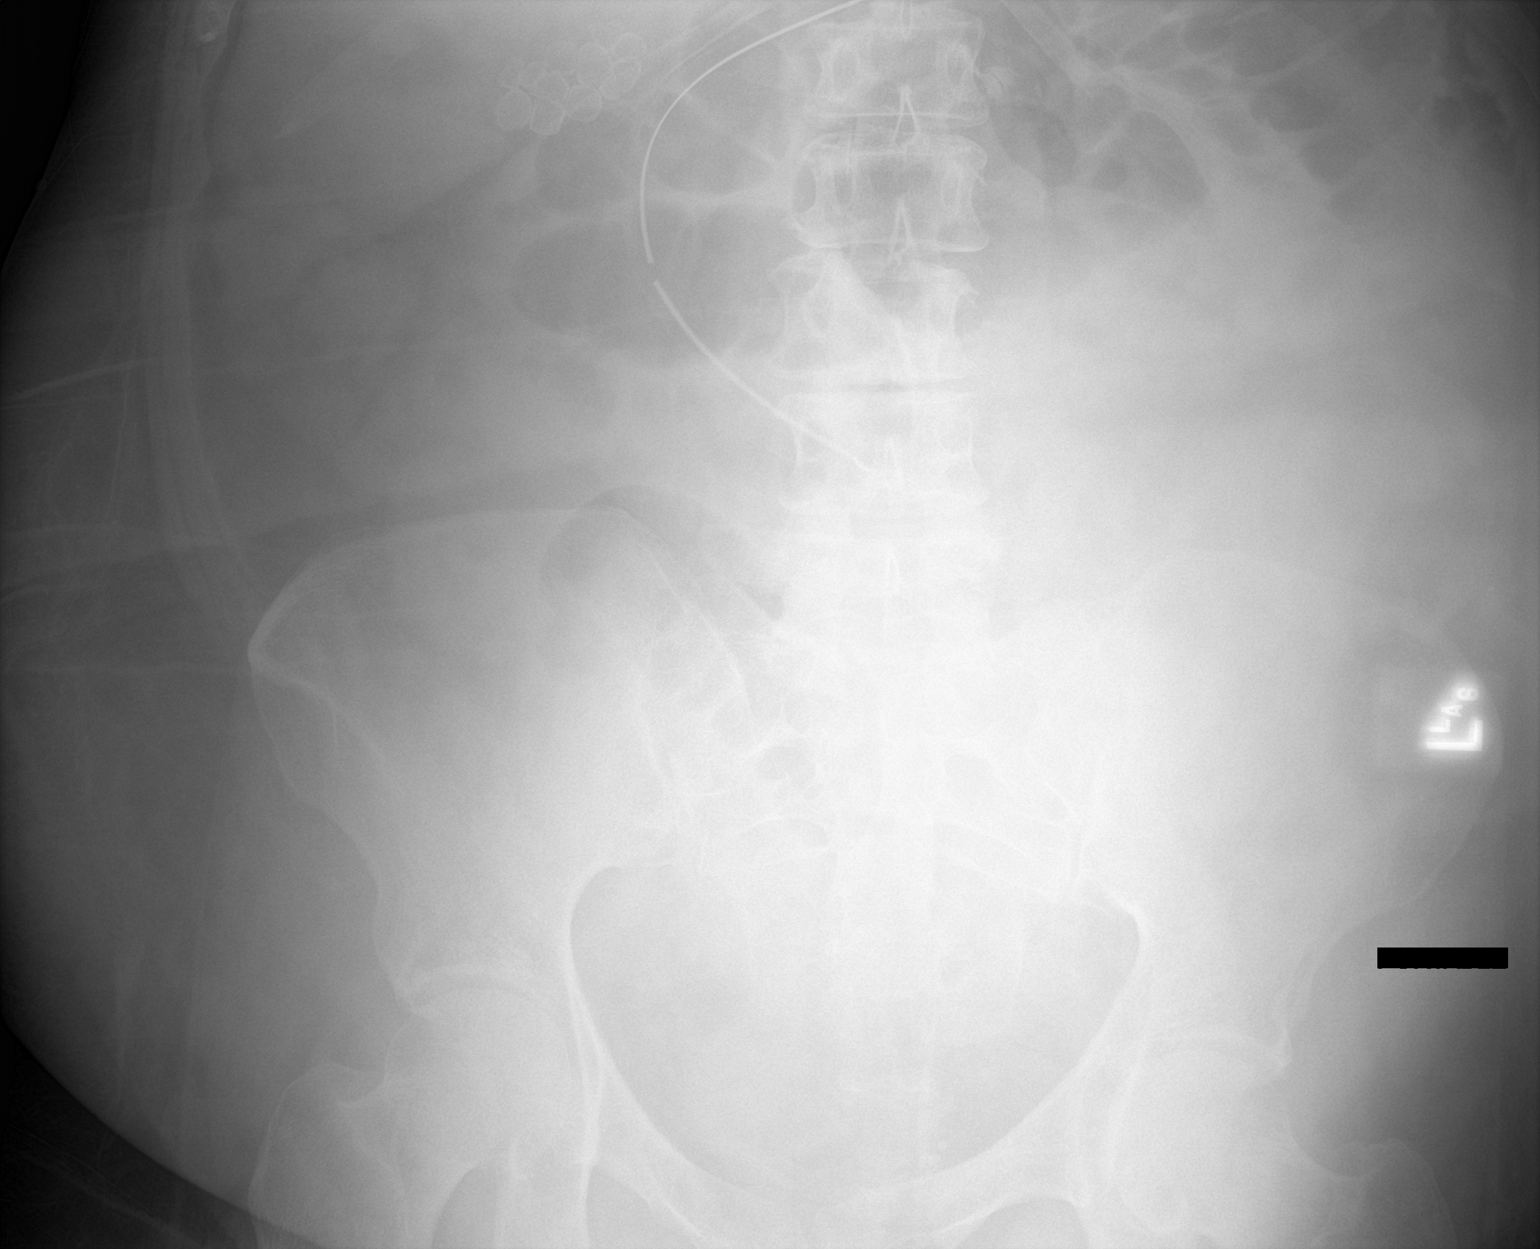

[abdomen kub (2 of 2)]
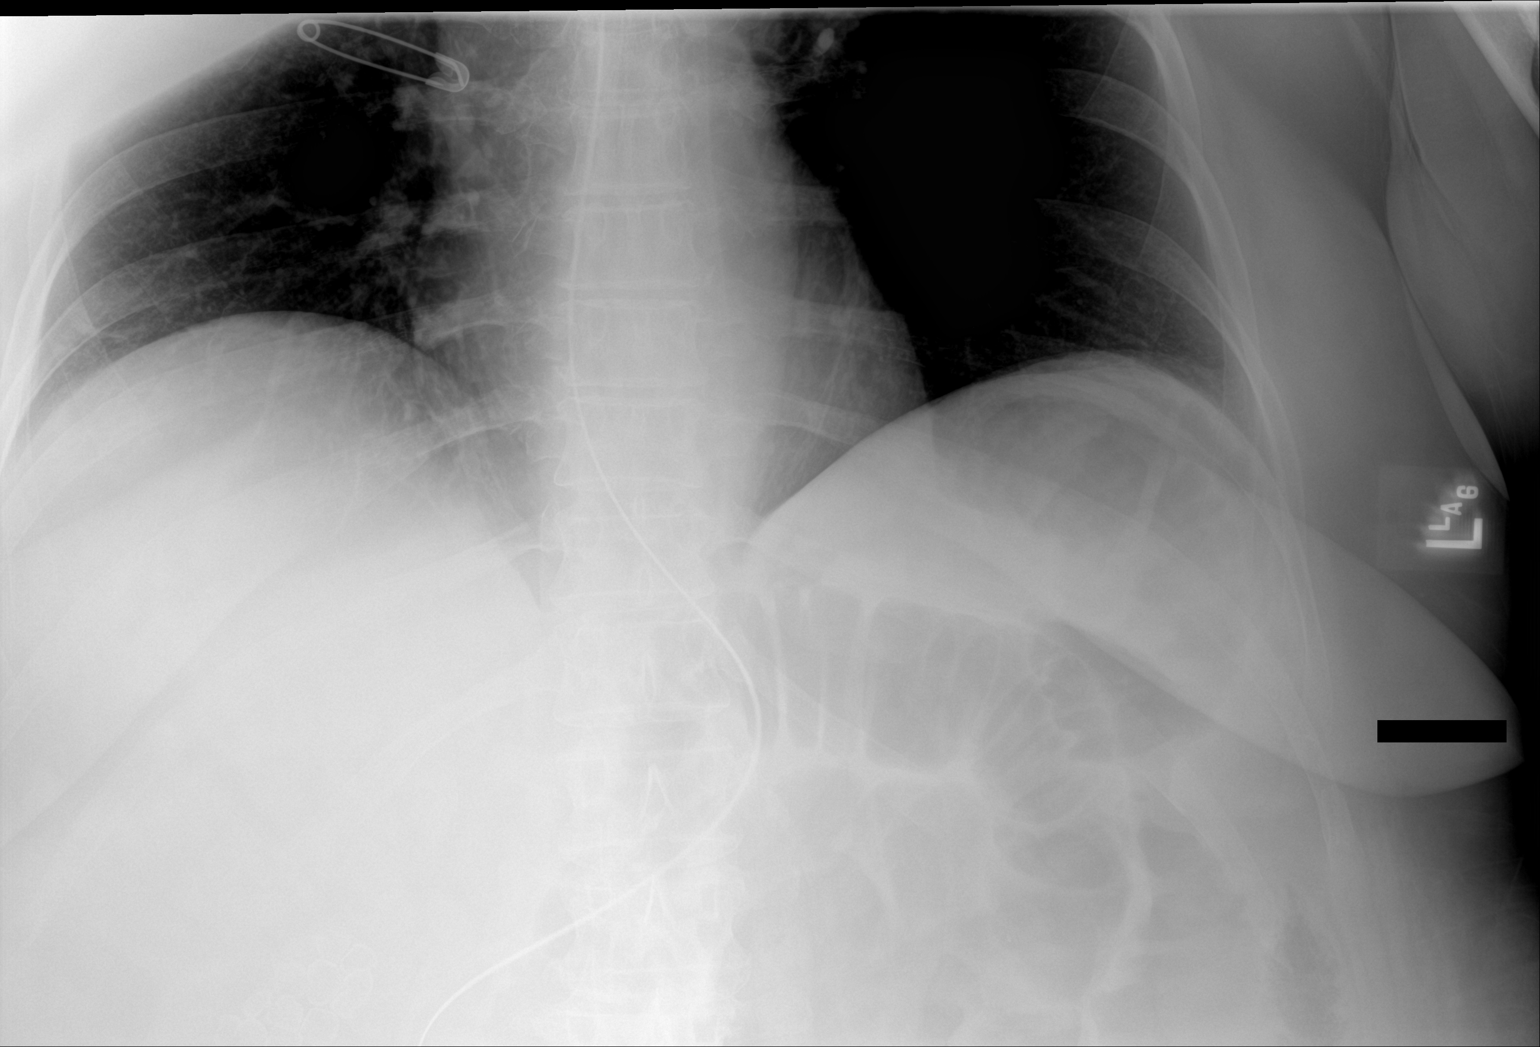

[2 of 2 positions shown; findings below may reference images not displayed]

FINDINGS: The enteric catheter tip is in stable position projecting over the
expected location of the distal stomach/proximal duodenum. There is
gaseous distention of small bowel in the left upper quadrant
measuring up to 4.3 cm suggesting persistent obstruction. There is
an overall paucity of bowel gas in the lower abdomen. There is no
definite free intraperitoneal air, within the confines of supine
technique. Gallstones are noted.

The bones are stable.
IMPRESSION: 1. Gaseous distention of bowel in the left upper quadrant measuring
up to 4.3 cm suggesting persistent obstruction.
2. Enteric catheter in stable position terminating in the distal
stomach/proximal duodenum.

## 2023-08-22 IMAGING — DX DG ABD PORTABLE 1V
3 series · 3 of 3 positions shown · non-contrast
Comparison: Radiograph 01/03/2022

CLINICAL DATA: Small bowel obstruction. Patient reports NG tube
came out this morning.

EXAM:
PORTABLE ABDOMEN - 1 VIEW

[abdomen kub (1 of 3)]
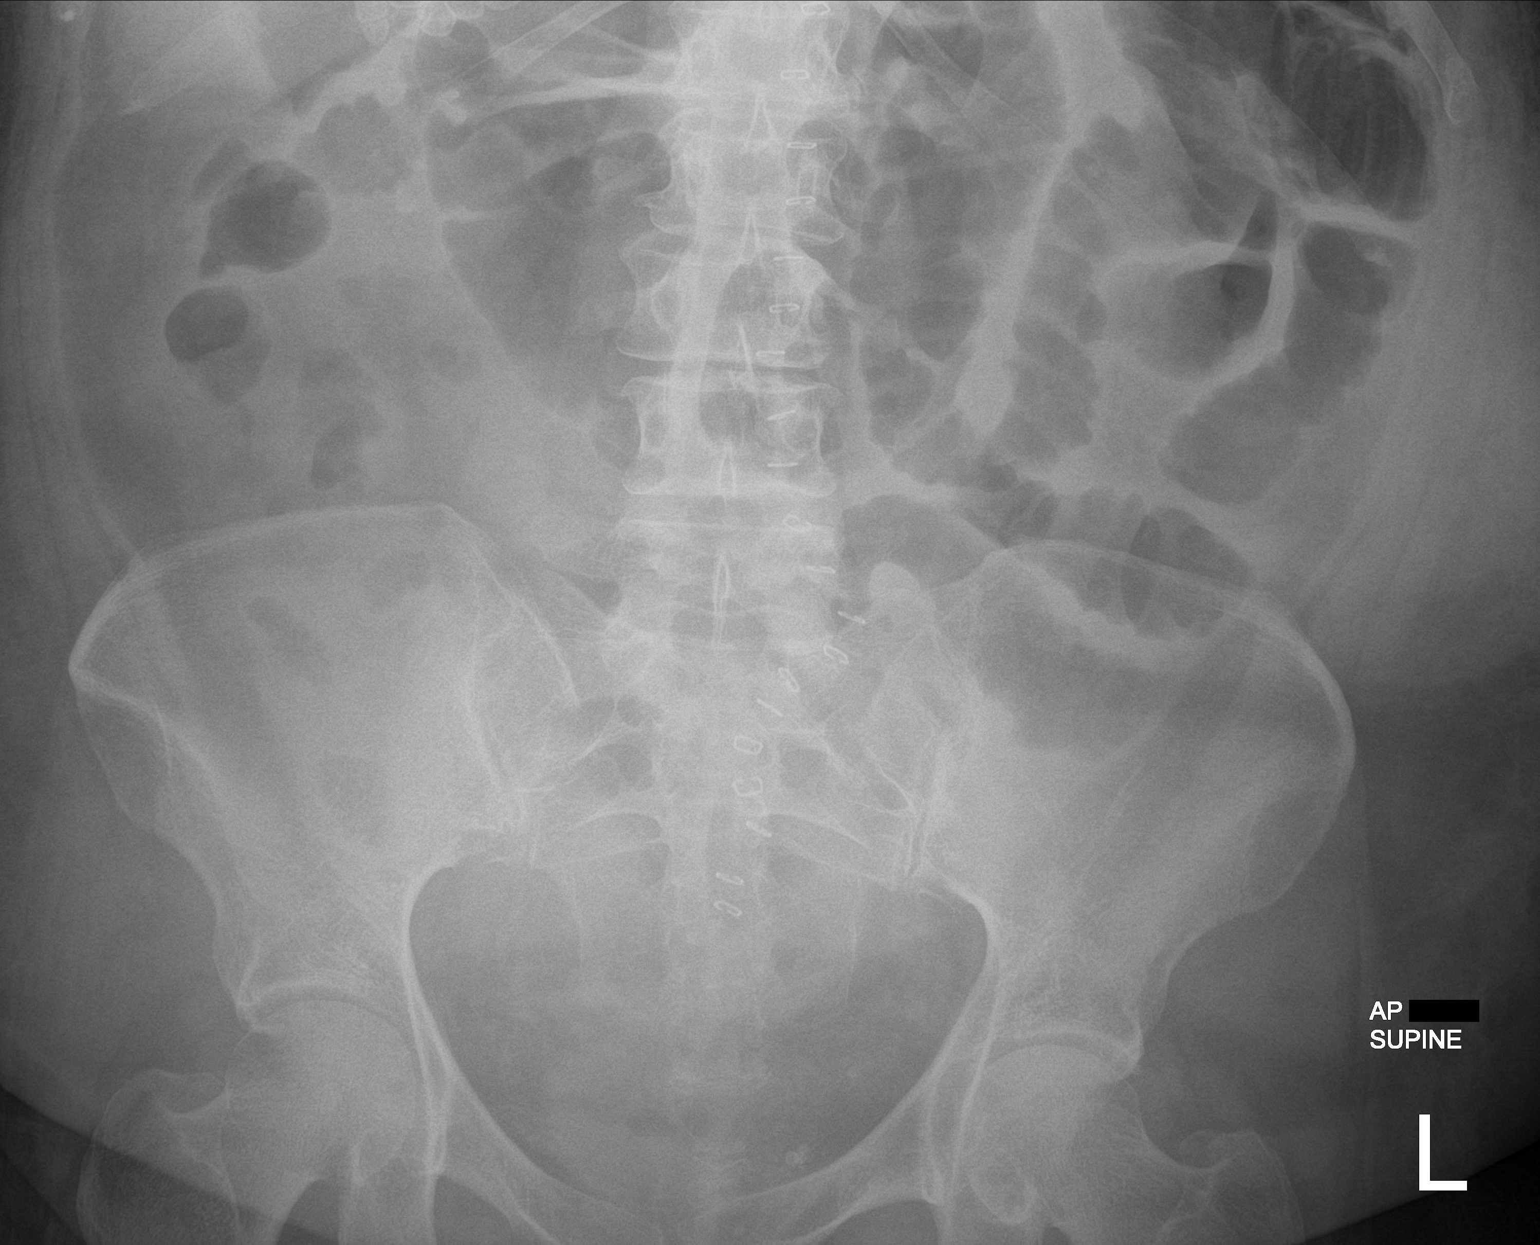

[abdomen kub (2 of 3)]
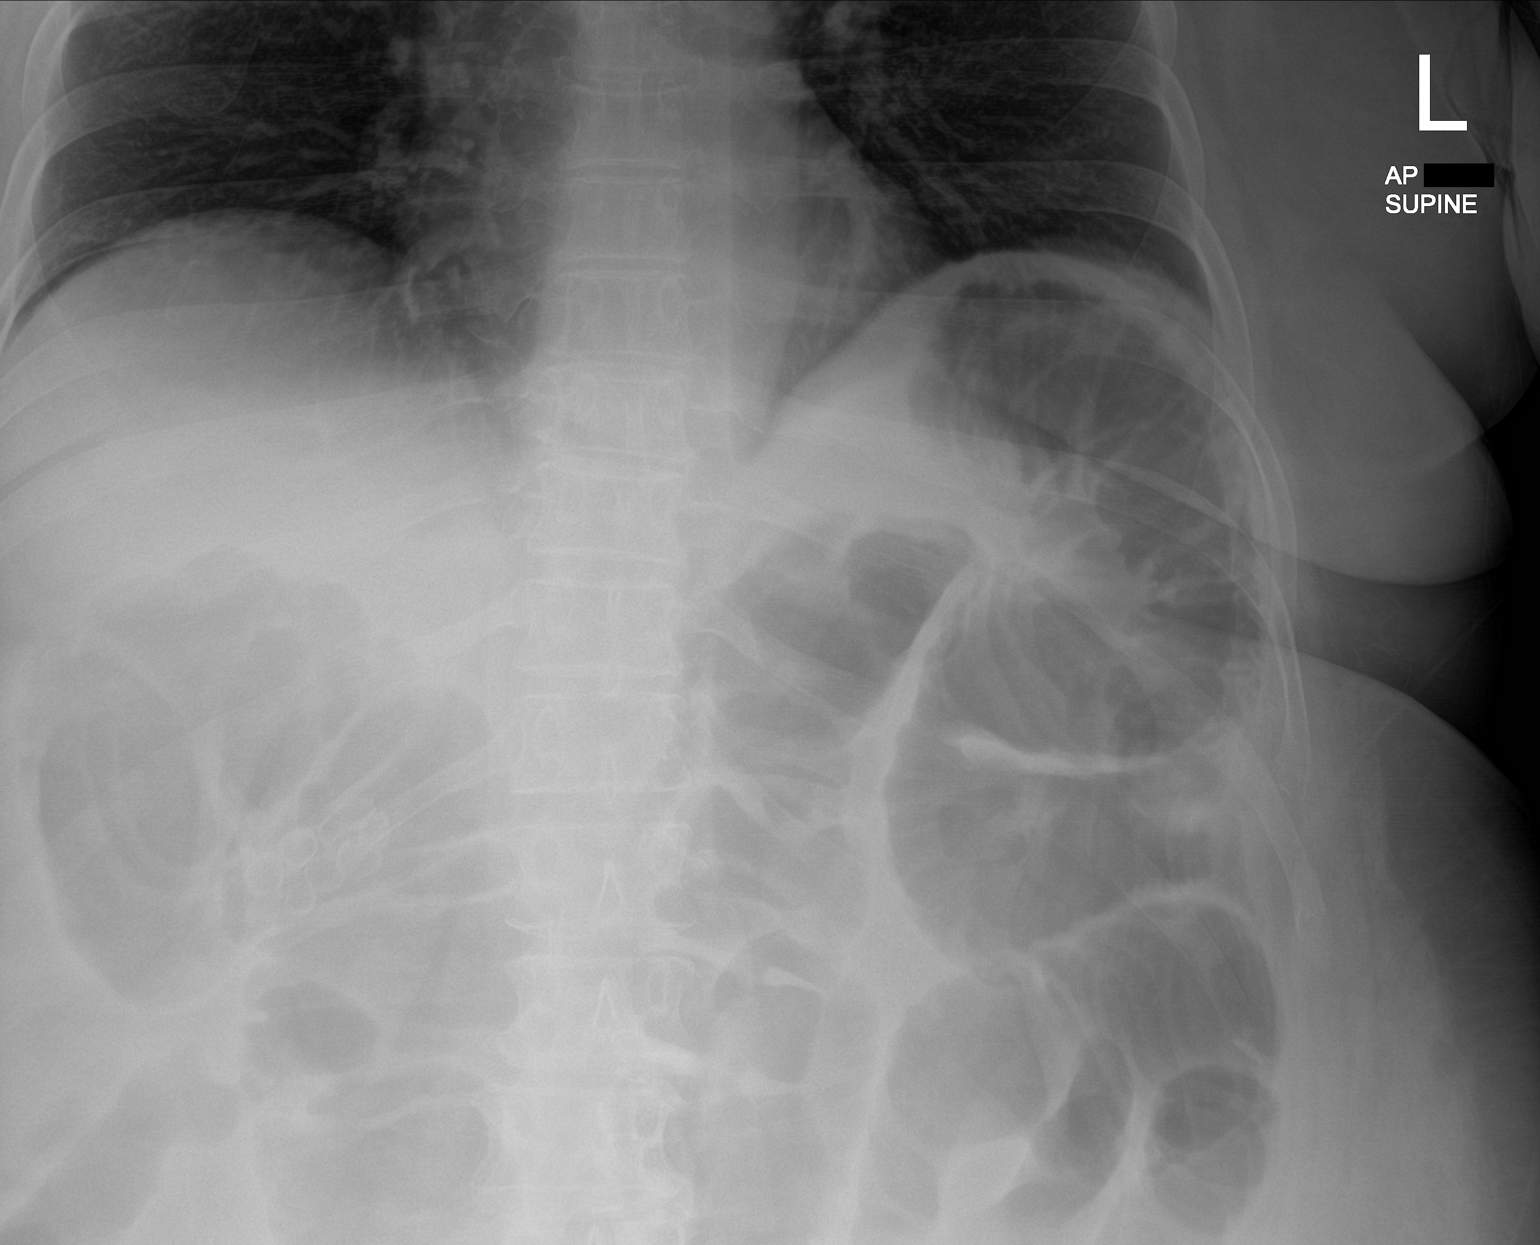

[abdomen kub (3 of 3)]
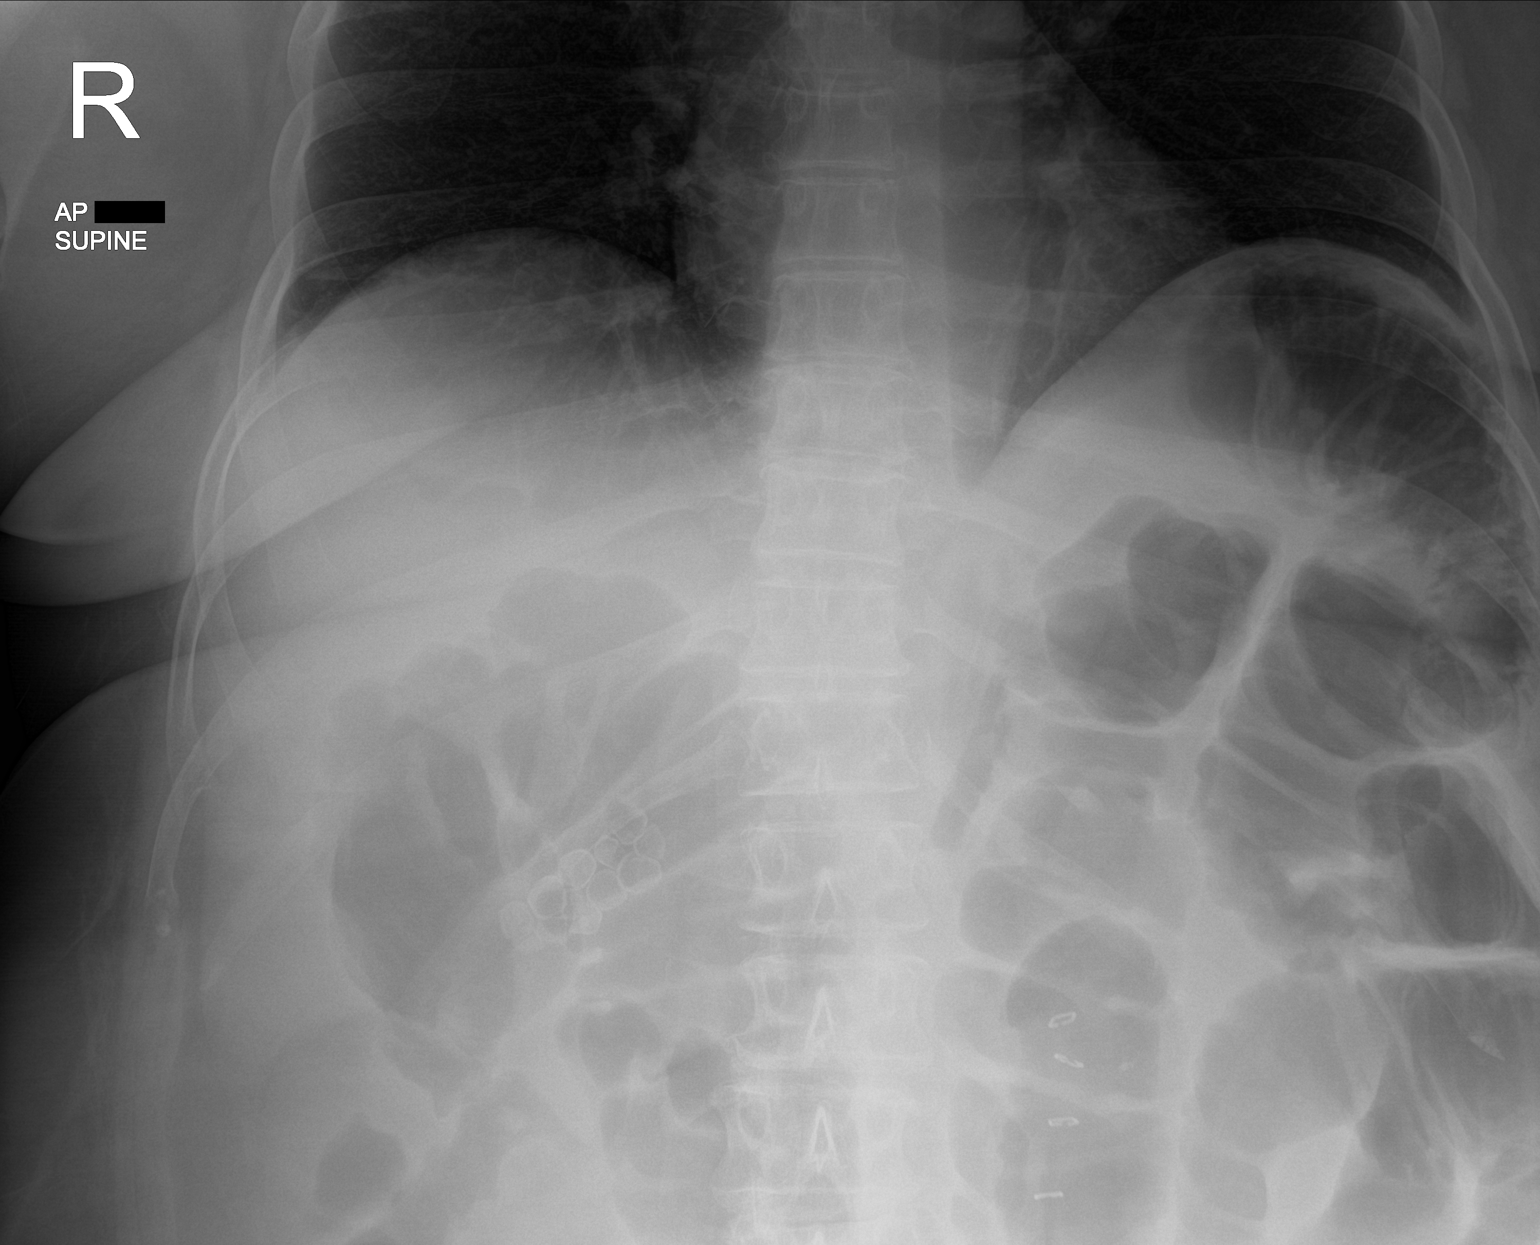

[3 of 3 positions shown; findings below may reference images not displayed]

FINDINGS: The enteric tube has been removed. Midline skin staples consistent
with interval surgery. There is gaseous distention of bowel in the
upper abdomen, likely combination of large and small bowel. Small
bowel distention up to 4.4 cm. Multiple gallstones.
IMPRESSION: 1. Gaseous distention of bowel in the upper abdomen likely
representing both small and large bowel, postoperative ileus versus
obstruction.
2. Multiple gallstones.
